# Patient Record
Sex: Male | Born: 1985 | Race: White | Hispanic: No | Marital: Single | Smoking: Never smoker
Health system: Southern US, Community
[De-identification: ages and names within clinical notes are randomized; demographics above are authoritative.]

## PROBLEM LIST (undated history)

## (undated) DIAGNOSIS — D2121 Benign neoplasm of connective and other soft tissue of right lower limb, including hip: Secondary | ICD-10-CM

## (undated) HISTORY — PX: LAPAROSCOPIC APPENDECTOMY: SUR753

---
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
## 2016-03-24 DIAGNOSIS — S6291XA Unspecified fracture of right wrist and hand, initial encounter for closed fracture: Secondary | ICD-10-CM | POA: Diagnosis not present

## 2016-03-24 DIAGNOSIS — S6991XA Unspecified injury of right wrist, hand and finger(s), initial encounter: Secondary | ICD-10-CM | POA: Diagnosis not present

## 2016-03-25 DIAGNOSIS — S62316A Displaced fracture of base of fifth metacarpal bone, right hand, initial encounter for closed fracture: Secondary | ICD-10-CM | POA: Diagnosis not present

## 2016-03-27 DIAGNOSIS — S63054A Dislocation of other carpometacarpal joint of right hand, initial encounter: Secondary | ICD-10-CM | POA: Diagnosis not present

## 2016-04-07 DIAGNOSIS — S62316D Displaced fracture of base of fifth metacarpal bone, right hand, subsequent encounter for fracture with routine healing: Secondary | ICD-10-CM | POA: Diagnosis not present

## 2016-04-22 DIAGNOSIS — S62316D Displaced fracture of base of fifth metacarpal bone, right hand, subsequent encounter for fracture with routine healing: Secondary | ICD-10-CM | POA: Diagnosis not present

## 2016-04-29 DIAGNOSIS — T84498A Other mechanical complication of other internal orthopedic devices, implants and grafts, initial encounter: Secondary | ICD-10-CM | POA: Diagnosis not present

## 2016-05-09 DIAGNOSIS — S62316D Displaced fracture of base of fifth metacarpal bone, right hand, subsequent encounter for fracture with routine healing: Secondary | ICD-10-CM | POA: Diagnosis not present

## 2016-06-10 DIAGNOSIS — S62316D Displaced fracture of base of fifth metacarpal bone, right hand, subsequent encounter for fracture with routine healing: Secondary | ICD-10-CM | POA: Diagnosis not present

## 2016-06-10 DIAGNOSIS — Z4789 Encounter for other orthopedic aftercare: Secondary | ICD-10-CM | POA: Diagnosis not present

## 2016-07-29 DIAGNOSIS — S134XXA Sprain of ligaments of cervical spine, initial encounter: Secondary | ICD-10-CM | POA: Diagnosis not present

## 2016-07-29 DIAGNOSIS — S338XXA Sprain of other parts of lumbar spine and pelvis, initial encounter: Secondary | ICD-10-CM | POA: Diagnosis not present

## 2016-07-29 DIAGNOSIS — M546 Pain in thoracic spine: Secondary | ICD-10-CM | POA: Diagnosis not present

## 2016-10-05 ENCOUNTER — Encounter (HOSPITAL_COMMUNITY): Payer: Self-pay

## 2016-10-05 ENCOUNTER — Emergency Department (HOSPITAL_COMMUNITY)
Admission: EM | Admit: 2016-10-05 | Discharge: 2016-10-05 | Disposition: A | Discharge status: Home / Self Care | Payer: 59 | Attending: Emergency Medicine | Admitting: Emergency Medicine

## 2016-10-05 DIAGNOSIS — J020 Streptococcal pharyngitis: Secondary | ICD-10-CM | POA: Diagnosis not present

## 2016-10-05 DIAGNOSIS — J36 Peritonsillar abscess: Secondary | ICD-10-CM | POA: Diagnosis not present

## 2016-10-05 DIAGNOSIS — J029 Acute pharyngitis, unspecified: Secondary | ICD-10-CM | POA: Diagnosis not present

## 2016-10-05 HISTORY — DX: Benign neoplasm of connective and other soft tissue of right lower limb, including hip: D21.21

## 2016-10-05 LAB — RAPID STREP SCREEN (MED CTR MEBANE ONLY): Streptococcus, Group A Screen (Direct): POSITIVE — AB

## 2016-10-05 MED ORDER — PENICILLIN V POTASSIUM 250 MG PO TABS: 500.0000 mg | ORAL_TABLET | Freq: Once | ORAL | Status: DC

## 2016-10-05 MED ORDER — DEXAMETHASONE 4 MG PO TABS
6.0000 mg | ORAL_TABLET | Freq: Once | ORAL | Status: AC
  Administered 2016-10-05: 6 mg via ORAL
  Filled 2016-10-05: qty 2

## 2016-10-05 MED ORDER — PENICILLIN G BENZATHINE 1200000 UNIT/2ML IM SUSP
1.2000 10*6.[IU] | Freq: Once | INTRAMUSCULAR | Status: AC
  Administered 2016-10-05: 1.2 10*6.[IU] via INTRAMUSCULAR
  Filled 2016-10-05: qty 2

## 2016-10-05 NOTE — ED Provider Notes (Signed)
[facility] DEPT Provider Note   CSN: [id number] Arrival date & time: 10/05/16  0008     History   Chief Complaint Chief Complaint  Patient presents with  . Sore Throat    HPI Oscar Henry is a 31 y.o. male.  This a normally healthy 31 year old male who presents with 9 or 10 days of sore throat.  He said initially it was on the left, but now it's on the right.  Some painful swallowing but is able to handle his own secretions.  Denies any shortness of breath, or fever, but he states that he has had chills      Past Medical History:  Diagnosis Date  . Fibroma of right lower leg 2004    There are no active problems to display for this patient.   Past Surgical History:  Procedure Laterality Date  . LAPAROSCOPIC APPENDECTOMY         Home Medications    Prior to Admission medications   Not on File    Family History No family history on file.  Social History Social History  Substance Use Topics  . Smoking status: Never Smoker  . Smokeless tobacco: Not on file  . Alcohol use Yes     Allergies   Patient has no known allergies.   Review of Systems Review of Systems  Constitutional: Positive for chills. Negative for fever.  HENT: Positive for sore throat and trouble swallowing. Negative for congestion.   Respiratory: Negative for cough and shortness of breath.   All other systems reviewed and are negative.    Physical Exam Updated Vital Signs BP 129/85 (BP Location: Left Arm)   Pulse 94   Temp 98.1 F (36.7 C) (Oral)   Resp 16   Ht 6' 1.5" (1.867 m)   Wt 102.1 kg (225 lb)   SpO2 97%   BMI 29.28 kg/m   Physical Exam  Constitutional: He appears well-developed and well-nourished. No distress.  HENT:  Head: Normocephalic.  Mouth/Throat:    No uvula deviation  Eyes: Pupils are equal, round, and reactive to light. EOM are normal.  Cardiovascular: Normal rate.   Pulmonary/Chest: Effort normal.  Abdominal: Soft.  Musculoskeletal:  Normal range of motion.  Lymphadenopathy:    He has cervical adenopathy.  Neurological: He is alert.  Skin: Skin is warm. No rash noted.  Nursing note and vitals reviewed.    ED Treatments / Results  Labs (all labs ordered are listed, but only abnormal results are displayed) Labs Reviewed  RAPID STREP SCREEN (NOT AT [facility]) - Abnormal; Notable for the following:       Result Value   Streptococcus, Group A Screen (Direct) POSITIVE (*)    All other components within normal limits    EKG  EKG Interpretation None       Radiology No results found.  Procedures Procedures (including critical care time)  Medications Ordered in ED Medications  dexamethasone (DECADRON) tablet 6 mg (not administered)  penicillin g benzathine (BICILLIN LA) 1200000 UNIT/2ML injection 1.2 Million Units (not administered)     Initial Impression / Assessment and Plan / ED Course  I have reviewed the triage vital signs and the nursing notes.  Pertinent labs & imaging results that were available during my care of the patient were reviewed by me and considered in my medical decision making (see chart for details).     Patient is strep positive.  Due to the physical nature of this area.  Concerned that he may have a  tonsillar abscess.  This has been discussed with the patient.  He will follow-up with ENT in the next 2-3 days He will be treated with IM penicillin and by mouth Decadron 1 dose   Final Clinical Impressions(s) / ED Diagnoses   Final diagnoses:  Strep pharyngitis  Peritonsillar abscess    New Prescriptions New Prescriptions   No medications on file     Junius Creamer, NP 10/05/16 0121    Ward, Delice Bison, DO 10/05/16 0134

## 2016-10-05 NOTE — ED Triage Notes (Signed)
Pt to ED c/o sore throat x 1 week, reports pain swallowing/eating as well as sinus pressure, ear pain. Pt denies fevers. Tonsillar inflammation noted.

## 2016-10-05 NOTE — Discharge Instructions (Addendum)
You have been given a dose of antibiotic in the emergency department and should not need any further antibiotics.  You have been given a referral to ENT.  Please call on Monday and make an appointment for further evaluation.  He can safely take alternating doses of Tylenol, ibuprofen for discomfort

## 2017-03-18 DIAGNOSIS — M25561 Pain in right knee: Secondary | ICD-10-CM | POA: Diagnosis not present

## 2017-03-18 DIAGNOSIS — M545 Low back pain: Secondary | ICD-10-CM | POA: Diagnosis not present

## 2017-03-24 DIAGNOSIS — M545 Low back pain: Secondary | ICD-10-CM | POA: Diagnosis not present

## 2017-03-24 DIAGNOSIS — M25561 Pain in right knee: Secondary | ICD-10-CM | POA: Diagnosis not present

## 2017-03-25 DIAGNOSIS — M545 Low back pain: Secondary | ICD-10-CM | POA: Diagnosis not present

## 2017-03-25 DIAGNOSIS — M25561 Pain in right knee: Secondary | ICD-10-CM | POA: Diagnosis not present

## 2017-03-31 DIAGNOSIS — M545 Low back pain: Secondary | ICD-10-CM | POA: Diagnosis not present

## 2017-03-31 DIAGNOSIS — M25561 Pain in right knee: Secondary | ICD-10-CM | POA: Diagnosis not present

## 2017-04-01 DIAGNOSIS — M25561 Pain in right knee: Secondary | ICD-10-CM | POA: Diagnosis not present

## 2017-04-01 DIAGNOSIS — M545 Low back pain: Secondary | ICD-10-CM | POA: Diagnosis not present

## 2017-04-07 DIAGNOSIS — M545 Low back pain: Secondary | ICD-10-CM | POA: Diagnosis not present

## 2017-04-07 DIAGNOSIS — M25561 Pain in right knee: Secondary | ICD-10-CM | POA: Diagnosis not present

## 2017-04-08 DIAGNOSIS — M25561 Pain in right knee: Secondary | ICD-10-CM | POA: Diagnosis not present

## 2017-04-08 DIAGNOSIS — M545 Low back pain: Secondary | ICD-10-CM | POA: Diagnosis not present

## 2017-04-14 DIAGNOSIS — J039 Acute tonsillitis, unspecified: Secondary | ICD-10-CM | POA: Diagnosis not present

## 2018-02-26 DIAGNOSIS — M545 Low back pain: Secondary | ICD-10-CM | POA: Diagnosis not present

## 2018-02-26 DIAGNOSIS — M25561 Pain in right knee: Secondary | ICD-10-CM | POA: Diagnosis not present

## 2019-08-30 ENCOUNTER — Ambulatory Visit: Payer: Self-pay

## 2019-08-30 ENCOUNTER — Other Ambulatory Visit: Payer: Self-pay

## 2019-08-30 ENCOUNTER — Encounter: Payer: Self-pay | Admitting: Orthopaedic Surgery

## 2019-08-30 ENCOUNTER — Ambulatory Visit: Payer: 59 | Admitting: Orthopaedic Surgery

## 2019-08-30 DIAGNOSIS — M25521 Pain in right elbow: Secondary | ICD-10-CM

## 2019-08-30 DIAGNOSIS — G8929 Other chronic pain: Secondary | ICD-10-CM

## 2019-08-30 DIAGNOSIS — M25561 Pain in right knee: Secondary | ICD-10-CM | POA: Diagnosis not present

## 2019-08-30 MED ORDER — METHYLPREDNISOLONE ACETATE 40 MG/ML IJ SUSP
40.0000 mg | INTRAMUSCULAR | Status: AC | PRN
  Administered 2019-08-30: 40 mg via INTRA_ARTICULAR

## 2019-08-30 MED ORDER — LIDOCAINE HCL 1 % IJ SOLN
3.0000 mL | INTRAMUSCULAR | Status: AC | PRN
  Administered 2019-08-30: 3 mL

## 2019-08-30 NOTE — Progress Notes (Signed)
Office Visit Note   Patient: Oscar Henry           Date of Birth: Feb 26, 1985           MRN: [id number] Visit Date: 08/30/2019              Requested by: [facility] [place],  [place] [id number] PCP: [facility]   Assessment & Plan: Visit Diagnoses:  1. Chronic pain of right knee   2. Pain in right elbow     Plan: He will perform quad strengthening exercises as shown today.  He can apply Voltaren gel to the knee up to 4 g 4 times daily.  In regards to knee exercises discussed knee friendly exercises with him today.  We will see how he is doing at his return visit in regards to the knee status post injection.  In regards to the right elbow due to the possible loose bodies within the elbow recommend MRI to evaluate for loose body.  We will have him work on lifting techniques for the lateral epicondylitis and stretching techniques as shown.  Also apply Voltaren gel up to 3 times daily to the lateral aspect of the elbow.  Questions were encouraged and answered at length.  Follow-Up Instructions: Return After MRI.   Orders:  Orders Placed This Encounter  Procedures  . Large Joint Inj  . XR Knee 1-2 Views Right  . XR Elbow 2 Views Right   No orders of the defined types were placed in this encounter.     Procedures: Large Joint Inj: R knee on 08/30/2019 9:28 AM Indications: pain Details: 22 G 1.5 in needle, anterolateral approach  Arthrogram: No  Medications: 3 mL lidocaine 1 %; 40 mg methylPREDNISolone acetate 40 MG/ML Outcome: tolerated well, no immediate complications Procedure, treatment alternatives, risks and benefits explained, specific risks discussed. Consent was given by the patient. Immediately prior to procedure a time out was called to verify the correct patient, procedure, equipment, support staff and site/side marked as required. Patient was prepped and draped in the usual sterile fashion.       Clinical Data: No additional  findings.   Subjective: Chief Complaint  Patient presents with  . Right Elbow - Pain  . Right Knee - Pain    HPI Oscar Henry is a 34 year old male comes in today with right elbow and right knee pain which has been ongoing for years.  Full active weight.  Played baseball, basketball, football wrestling.  Also was in the [employer].  He states in regards to the right elbow that most of his pain is localized to one point he points to the right lateral epicondyle region.  He does report a fall on concrete while in the [employer] feels that he may have some possible bony fragments in the elbow but has had no x-rays on this.  He notes that the pain lateral aspect the elbow is sharp with activities such as gripping a coffee cup or anything that is heavy.  Has had no treatment for the right elbow pain.  Pain is not constant.  Right knee pain has been ongoing for years no known injury.  Mechanical symptoms positive for giving way only.  Denies any swelling.  Pain is mostly anterior aspect knee.  Has pain going up and down stairs and also up hills.  He notes that he just not have the strength in the right leg compared to his left leg.  He does have some  discomfort in the left leg but not to the extent that he has in the right knee.  Recently took a Medrol Dosepak about 2 months ago due to some poison oak.  He feels that the Medrol helped with his L5-S1 degenerative disc disease but really did not help with his elbow or his knee.  He does not take any anti-inflammatories on a daily basis.  Did try knee brace with no relief.  Otherwise he has had no treatment for the above complaints.  Review of Systems  Constitutional: Negative for chills and fever.  Cardiovascular: Negative for chest pain.  Musculoskeletal: Positive for arthralgias and back pain.     Objective: Vital Signs: There were no vitals taken for this visit.  Physical Exam Constitutional:      Appearance: He is not ill-appearing or diaphoretic.    Cardiovascular:     Pulses: Normal pulses.  Pulmonary:     Effort: Pulmonary effort is normal.  Neurological:     Mental Status: He is alert.  Psychiatric:        Mood and Affect: Mood normal.     Ortho Exam Bilateral knees: Full range of motion.  No tenderness along medial lateral joint line.  No instability with valgus varus stressing.  Anterior drawer is negative bilaterally.  Patellofemoral crepitus bilaterally with passive range of motion of both knees.  No abnormal warmth erythema or effusion of either knee.  Right elbow full range of motion.  Tenderness over the right lateral epicondyle region.  Provocative maneuvers causes pain lateral epicondyle region.  Slight crepitus with passive range of motion of the right elbow.  Full supination pronation right forearm.  No rashes skin lesions ulcerations about the right elbow. Specialty Comments:  No specialty comments available.  Imaging: XR Elbow 2 Views Right  Result Date: 08/30/2019 Right elbow 2 views: No acute fractures or bony abnormalities.  Possible loose bodies within the elbow joint seen particularly on the lateral view.  Elbow is well located.  XR Knee 1-2 Views Right  Result Date: 08/30/2019 Right knee 2 views: No acute fractures.  Medial lateral joint line well-preserved.  Slight irregularity to the patella articular surface on the lateral view.  Otherwise no bony abnormalities knee is well located.    PMFS History: There are no problems to display for this patient.  Past Medical History:  Diagnosis Date  . Fibroma of right lower leg 2004    History reviewed. No pertinent family history.  Past Surgical History:  Procedure Laterality Date  . LAPAROSCOPIC APPENDECTOMY     Social History   Occupational History  . Not on file  Tobacco Use  . Smoking status: Never Smoker  Substance and Sexual Activity  . Alcohol use: Yes  . Drug use: No  . Sexual activity: Not on file

## 2019-09-27 ENCOUNTER — Ambulatory Visit
Admission: RE | Admit: 2019-09-27 | Discharge: 2019-09-27 | Disposition: A | Discharge status: Home / Self Care | Payer: 59 | Source: Ambulatory Visit | Attending: Physician Assistant | Admitting: Physician Assistant

## 2019-09-27 ENCOUNTER — Other Ambulatory Visit: Payer: Self-pay

## 2019-09-27 DIAGNOSIS — M25521 Pain in right elbow: Secondary | ICD-10-CM

## 2019-09-27 IMAGING — MR MR ELBOW*R* W/O CM
4 of 5 series · 13 of 40 positions shown · non-contrast
Comparison: None.

CLINICAL DATA: Right elbow pain.  Chronic mechanical symptoms

EXAM:
MRI OF THE RIGHT ELBOW WITHOUT CONTRAST
TECHNIQUE: Multiplanar, multisequence MR imaging of the elbow was performed. No
intravenous contrast was administered.

[Series 3: T1 · axial · right · 3.0mm · 0.16mm/px · z∈[-16,+66]mm · 3 of 29 slices shown]
[im 4/29]
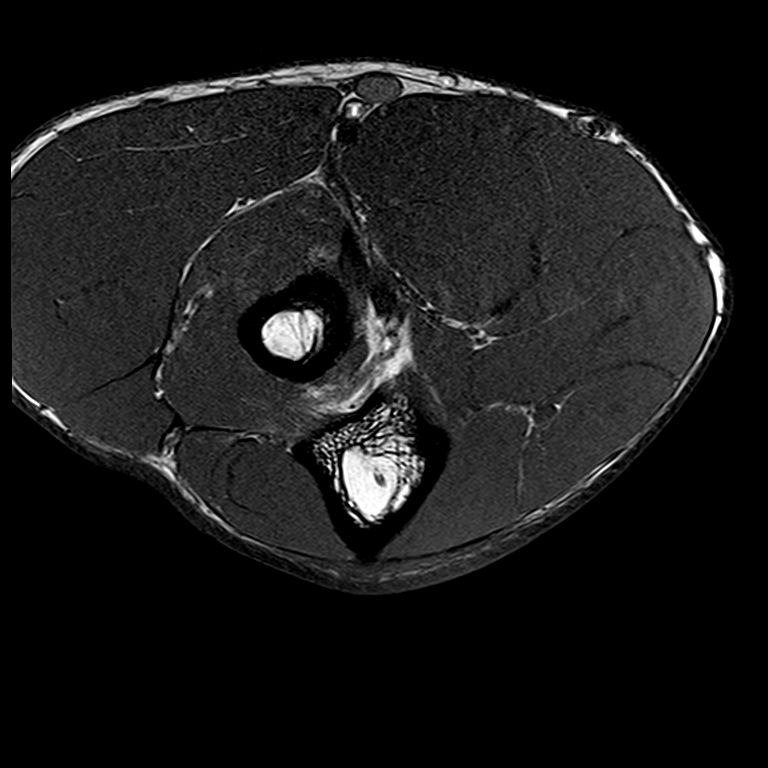
[im 15/29]
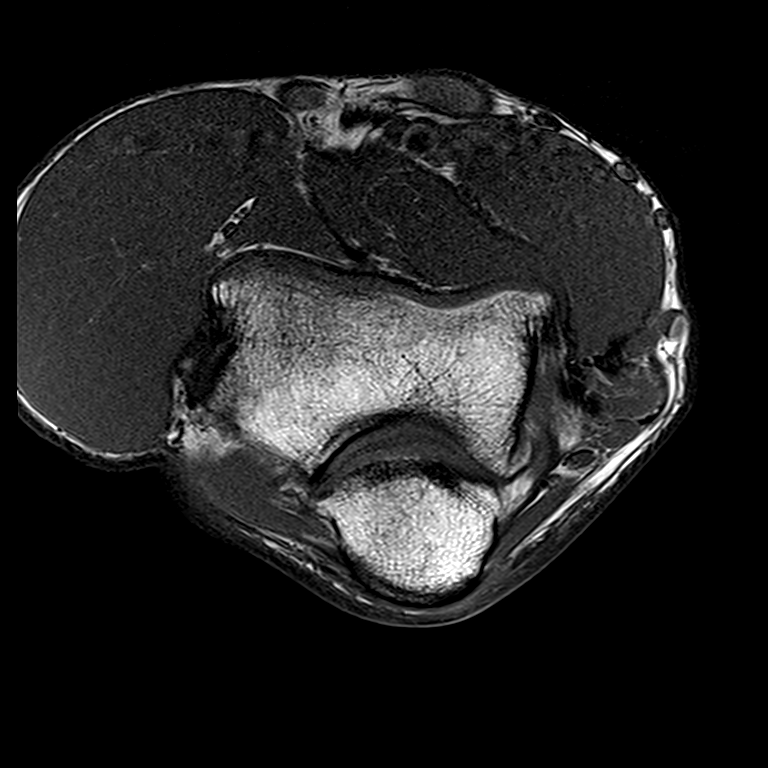
[im 25/29]
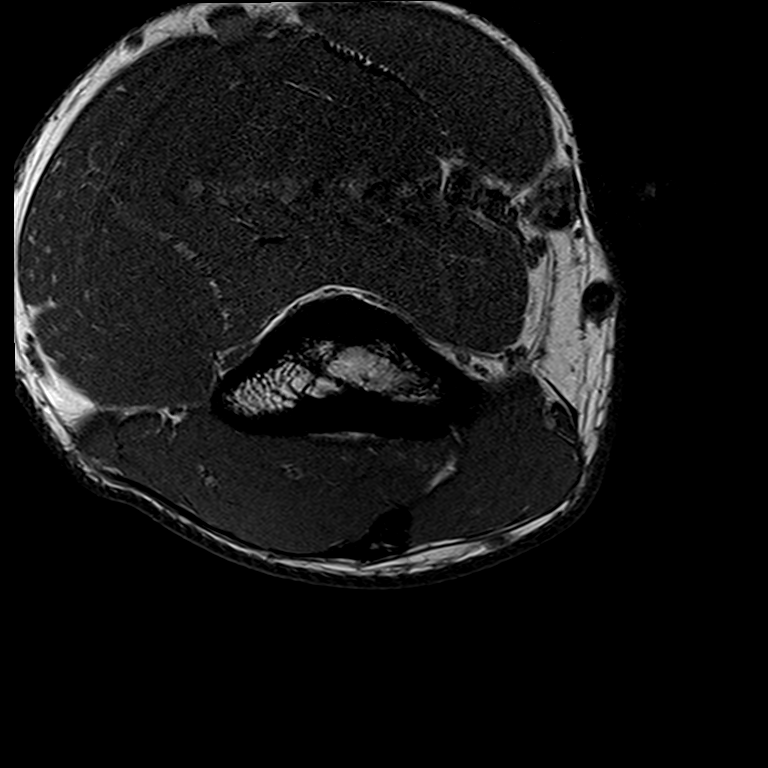

[Series 4: T2 fat-sat · axial · right · 3.0mm · 0.19mm/px · z∈[-16,+66]mm · 3 of 29 slices shown (1 of 2)]
[im 4/29]
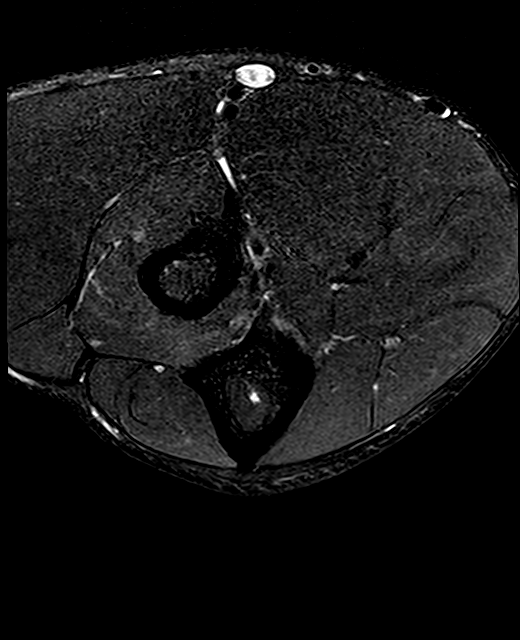
[im 15/29]
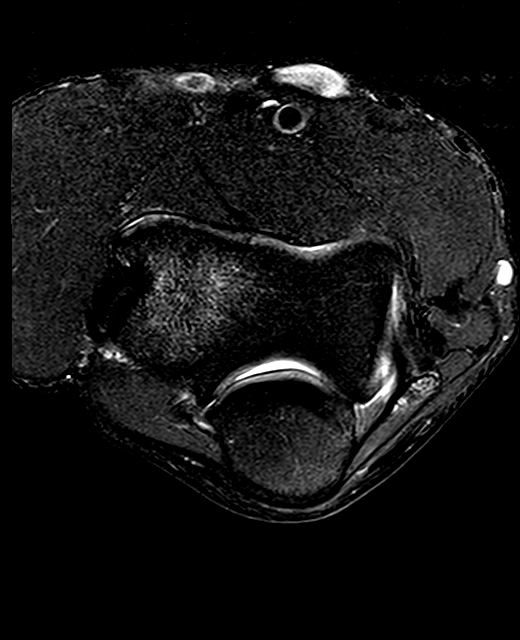
[im 25/29]
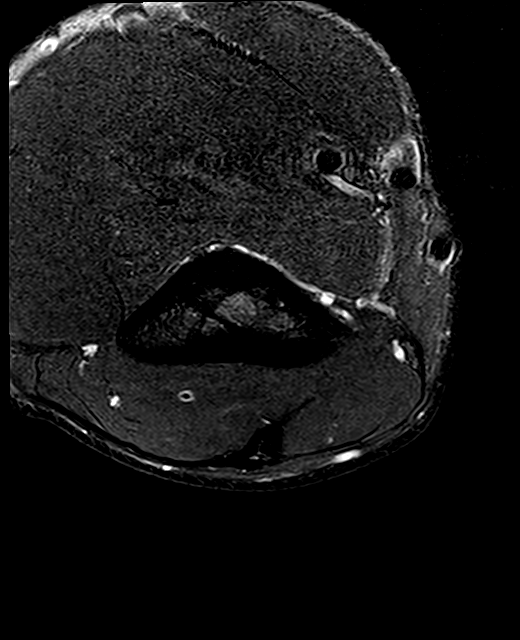

[Series 5: T2 fat-sat · coronal · right · 3.0mm · 0.22mm/px · 3 of 21 slices shown (2 of 2)]
[im 4/21]
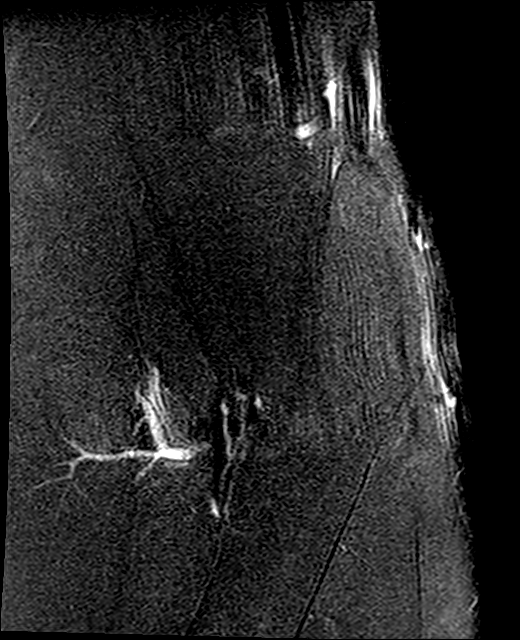
[im 11/21]
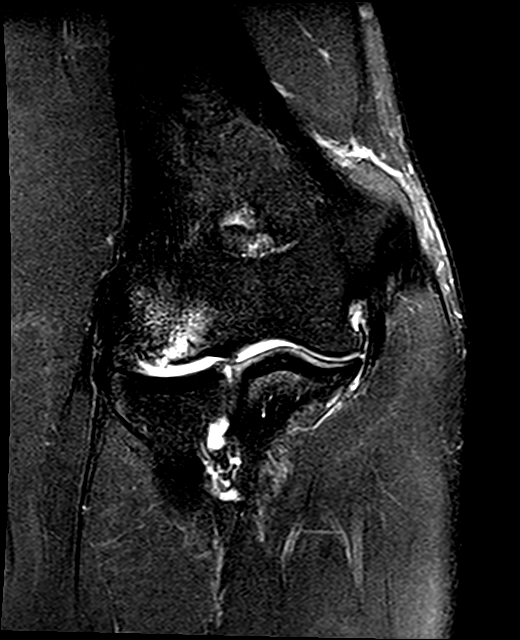
[im 17/21]
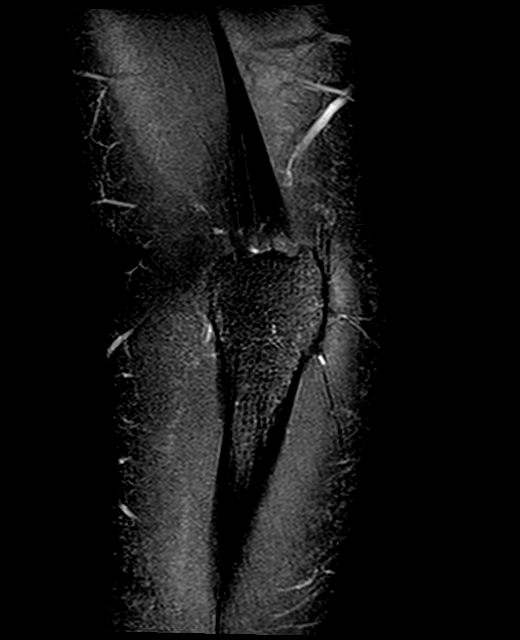

[Series 7: PD fat-sat · sagittal · right · 3.0mm · 0.22mm/px · 4 of 25 slices shown]
[im 1/25]
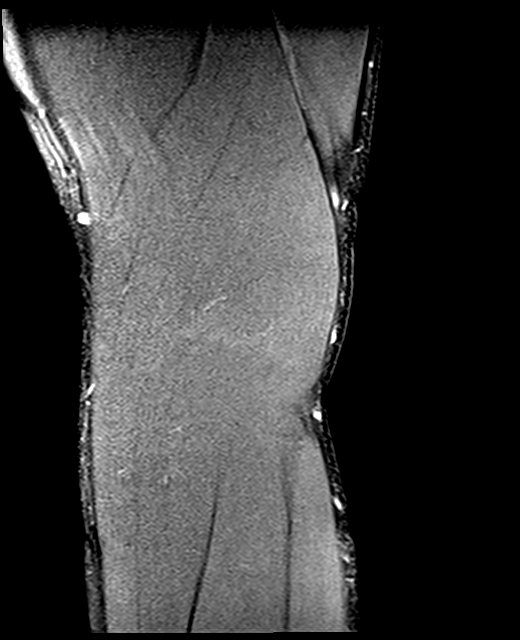
[im 4/25]
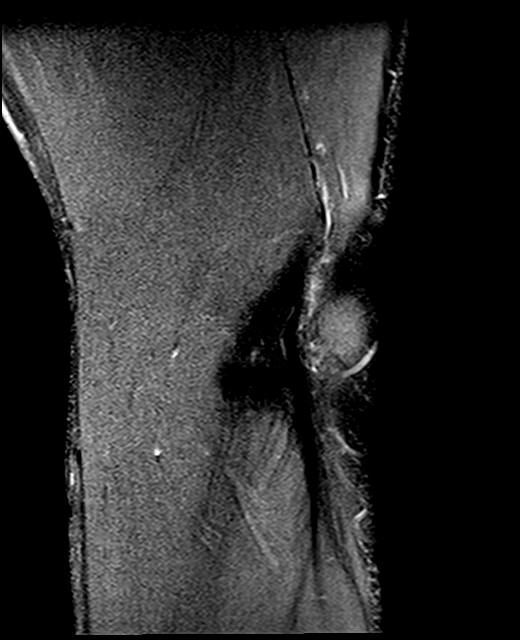
[im 14/25]
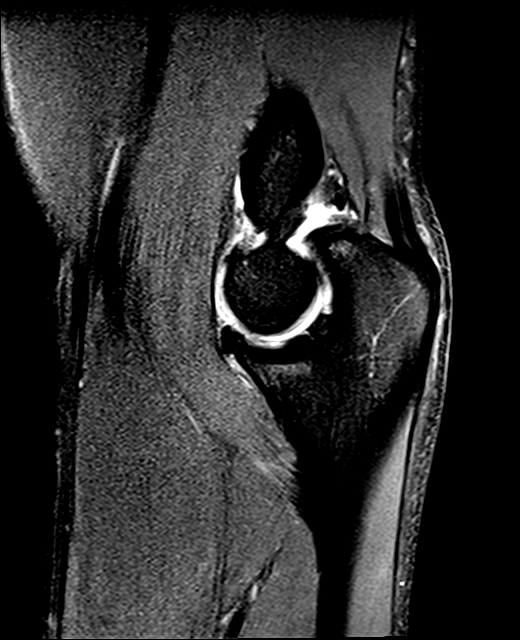
[im 21/25]
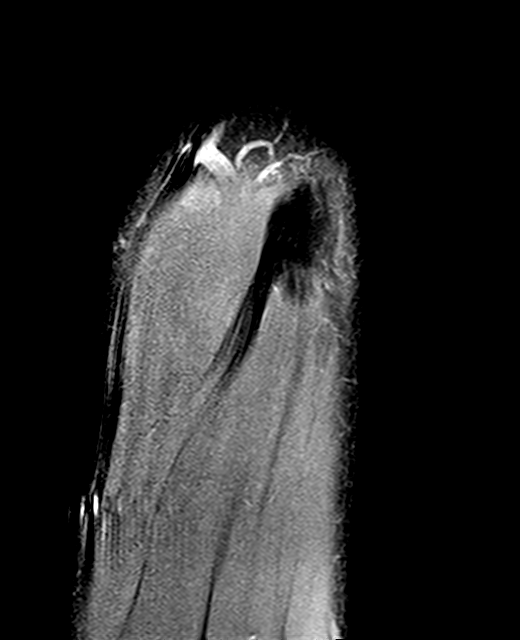

[13 of 40 positions shown; findings below may reference images not displayed]

FINDINGS: TENDONS

Common forearm flexor origin: Mild tendinosis of the common flexor
tendon origin.

Common forearm extensor origin: Intact

Biceps: Intact

Triceps: Intact

LIGAMENTS

Medial stabilizers: Intact

Lateral stabilizers:  Intact

Cartilage: Partial thickness cartilage loss of the capitellum with
areas of full-thickness cartilage loss and subchondral reactive
marrow edema. Partial thickness cartilage loss of the radial head.
Partial-thickness cartilage loss of the ulnohumeral joint with small
marginal osteophytes.

Joint: Small joint effusion.  No intra-articular loose body.

Cubital tunnel: Normal.

Bones: No fracture or dislocation. No marrow abnormality.

Soft Tissues: Muscles are normal. No fluid collection or hematoma.
IMPRESSION: 1. Mild tendinosis of the common flexor tendon origin.
2. Partial thickness cartilage loss of the capitellum with areas of
full-thickness cartilage loss and subchondral reactive marrow edema.
Partial thickness cartilage loss of the radial head.
3. Partial thickness cartilage loss of the ulnohumeral joint with
small marginal osteophytes.

## 2019-10-10 ENCOUNTER — Encounter: Payer: Self-pay | Admitting: Orthopaedic Surgery

## 2019-10-10 ENCOUNTER — Ambulatory Visit: Payer: 59 | Admitting: Orthopaedic Surgery

## 2019-10-10 ENCOUNTER — Other Ambulatory Visit: Payer: Self-pay

## 2019-10-10 DIAGNOSIS — M19021 Primary osteoarthritis, right elbow: Secondary | ICD-10-CM | POA: Diagnosis not present

## 2019-10-10 DIAGNOSIS — M25561 Pain in right knee: Secondary | ICD-10-CM

## 2019-10-10 DIAGNOSIS — G8929 Other chronic pain: Secondary | ICD-10-CM | POA: Diagnosis not present

## 2019-10-10 NOTE — Progress Notes (Signed)
The patient is a 34 year old gentleman seen in follow-up to go over an MRI of his right elbow.  He is also had a steroid injection in his right knee we will do see how he responded to that.  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  On examination of his right elbow he can flex and extend elbow.  It is ligamentously stable on exam.  The MRI does show areas of partial to full-thickness cartilage loss of the radiocapitellar joint and the ulnohumeral joint with no loose bodies.  There are some slight subchondral edema in the capitellum.  The ligaments and tendons again are all intact.  Examination of his right knee does show some patellofemoral pain as well as pain when stressing the medial compartment of the knee.  The knee does slightly hyperextend.  At this point given the failure of conservative treatment on his right knee we will obtain an MRI of the right knee.  From an elbow standpoint, we are going to hold off on any other treatment since this is more of an annoying type of the pain that may benefit from steroid injection at some point if it bothers him enough.  He knows to hold off any heavy lifting for that elbow and avoiding push-ups and pull-ups as well.  All question concerns were answered addressed.  We will see him back after the MRI of his right knee.

## 2019-10-25 ENCOUNTER — Ambulatory Visit: Payer: 59 | Admitting: Orthopaedic Surgery

## 2019-10-31 ENCOUNTER — Ambulatory Visit
Admission: RE | Admit: 2019-10-31 | Discharge: 2019-10-31 | Disposition: A | Discharge status: Home / Self Care | Payer: 59 | Source: Ambulatory Visit | Attending: Orthopaedic Surgery | Admitting: Orthopaedic Surgery

## 2019-10-31 DIAGNOSIS — G8929 Other chronic pain: Secondary | ICD-10-CM

## 2019-10-31 DIAGNOSIS — M25561 Pain in right knee: Secondary | ICD-10-CM

## 2019-10-31 IMAGING — MR MR KNEE*R* W/O CM
4 of 6 series · 19 of 40 positions shown · non-contrast
Comparison: None.

CLINICAL DATA: Right knee pain and weakness.

EXAM:
MRI OF THE RIGHT KNEE WITHOUT CONTRAST
TECHNIQUE: Multiplanar, multisequence MR imaging of the knee was performed. No
intravenous contrast was administered.

[Series 3: T2 fat-sat · axial · 4.0mm · 0.31mm/px · z∈[-60,+28]mm · 3 of 30 slices shown (1 of 2)]
[im 5/30]
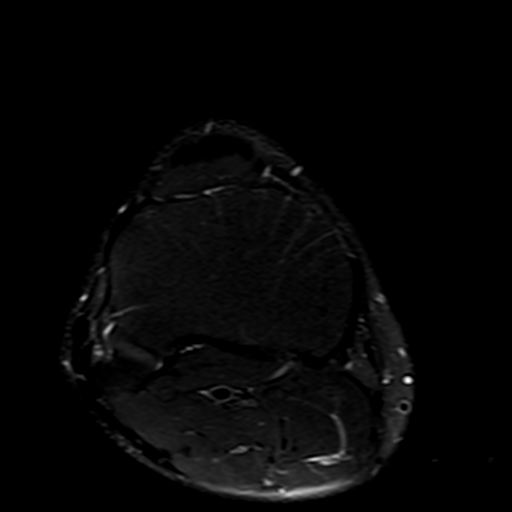
[im 17/30]
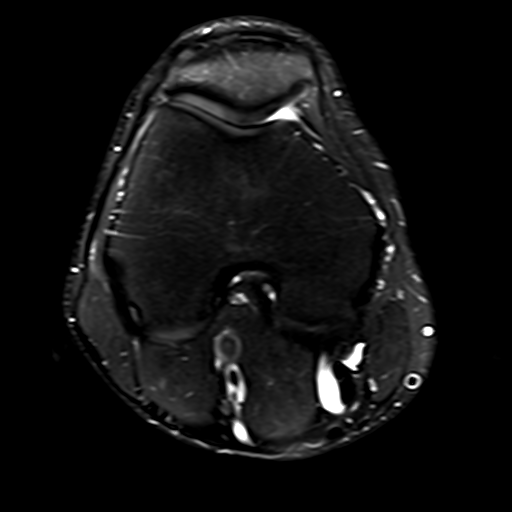
[im 25/30]
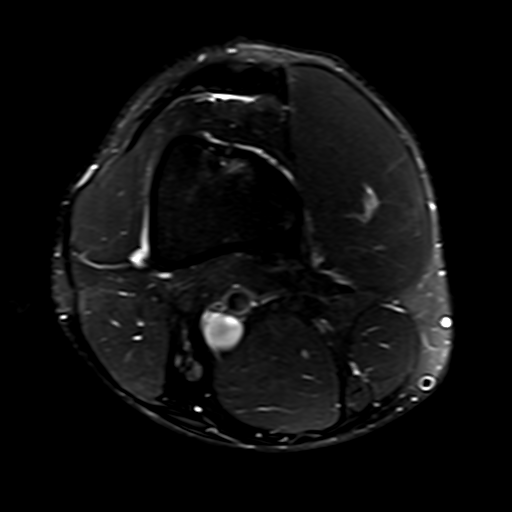

[Series 5: T2 fat-sat · coronal · 4.0mm · 0.29mm/px · 3 of 28 slices shown (2 of 2)]
[im 6/28]
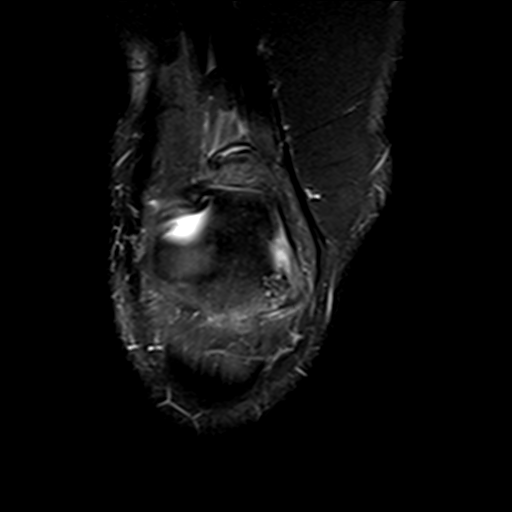
[im 17/28]
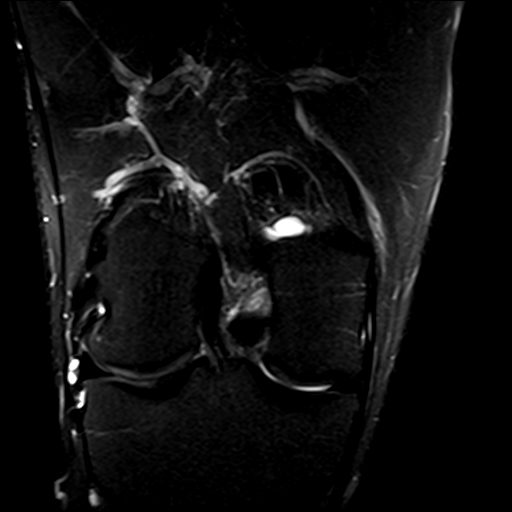
[im 28/28]
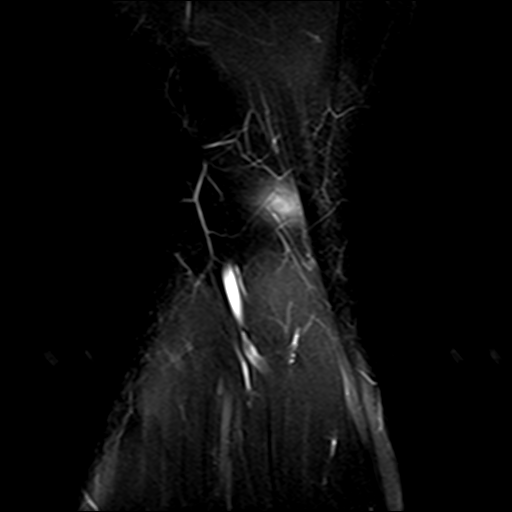

[Series 6: PD fat-sat · coronal · 4.0mm · 0.29mm/px · 6 of 28 slices shown (1 of 2)]
[im 1/28]
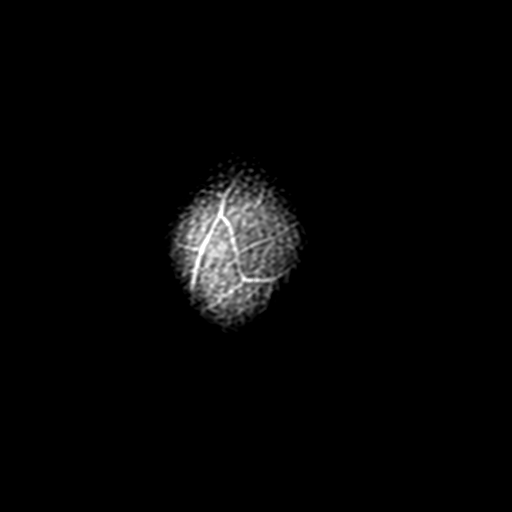
[im 6/28]
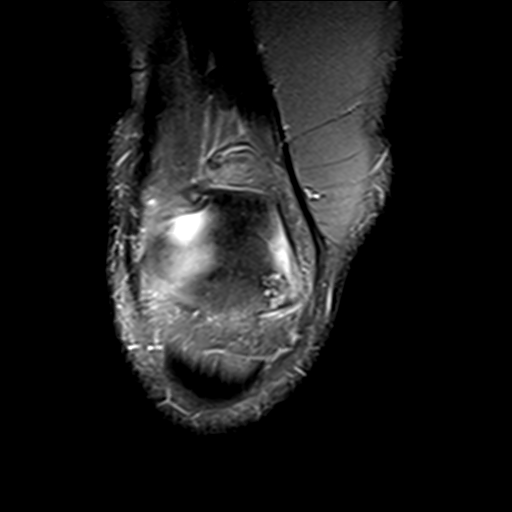
[im 11/28]
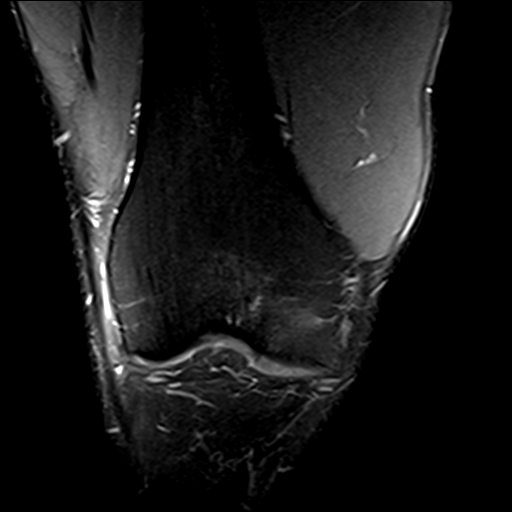
[im 17/28]
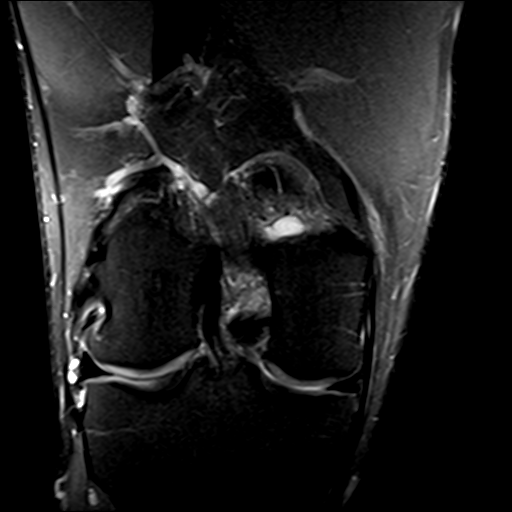
[im 22/28]
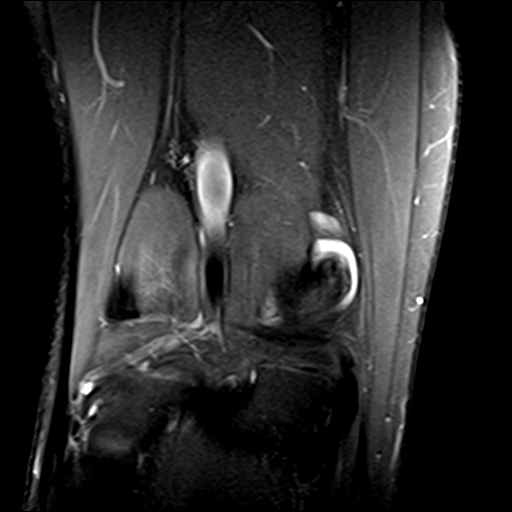
[im 28/28]
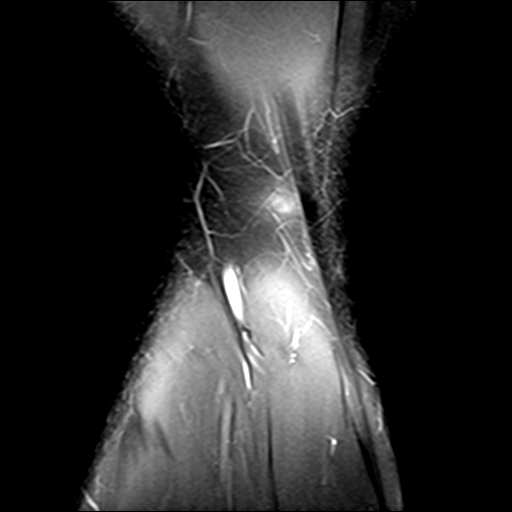

[Series 7: PD fat-sat · sagittal · 3.0mm · 0.29mm/px · 7 of 30 slices shown (2 of 2)]
[im 1/30]
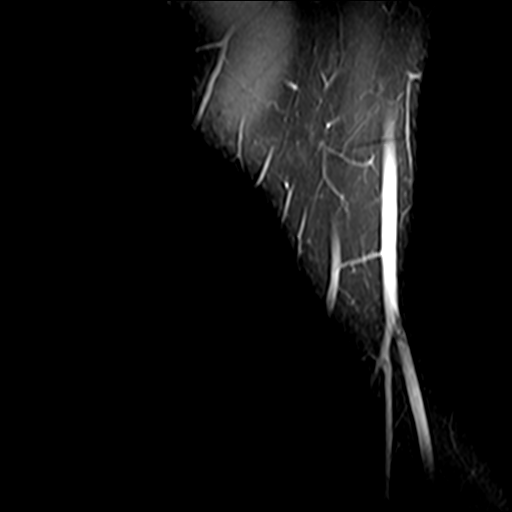
[im 5/30]
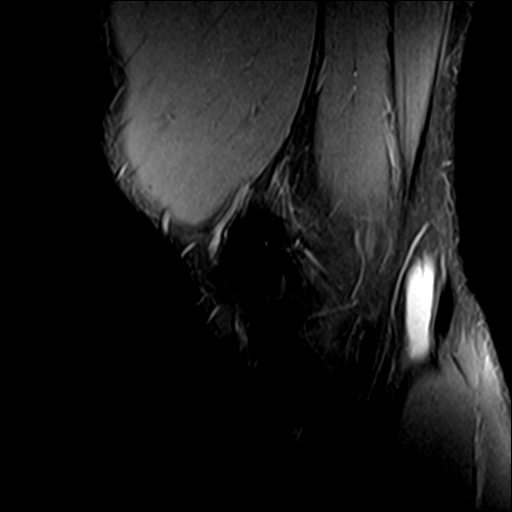
[im 10/30]
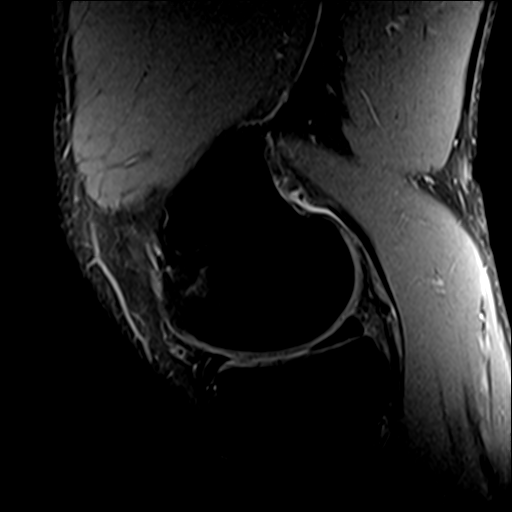
[im 15/30]
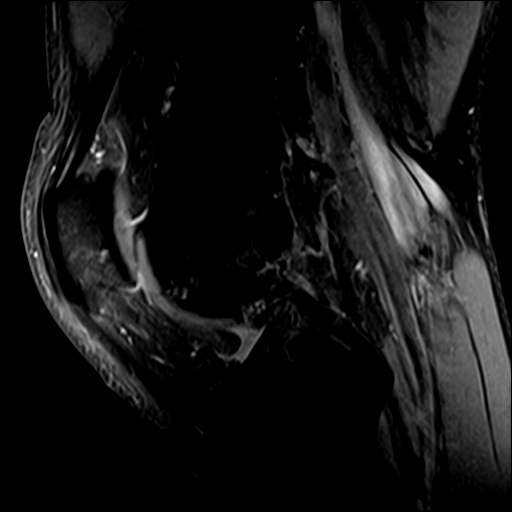
[im 20/30]
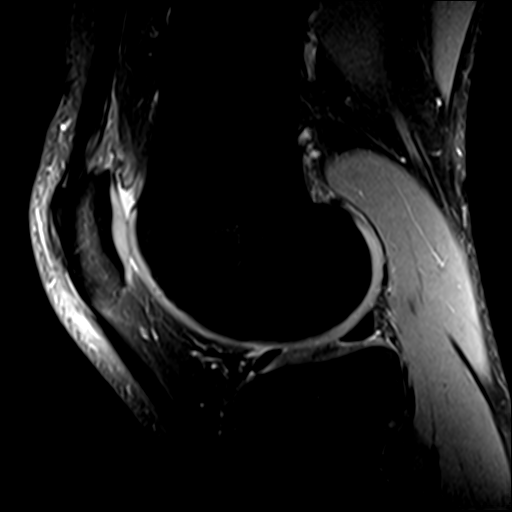
[im 25/30]
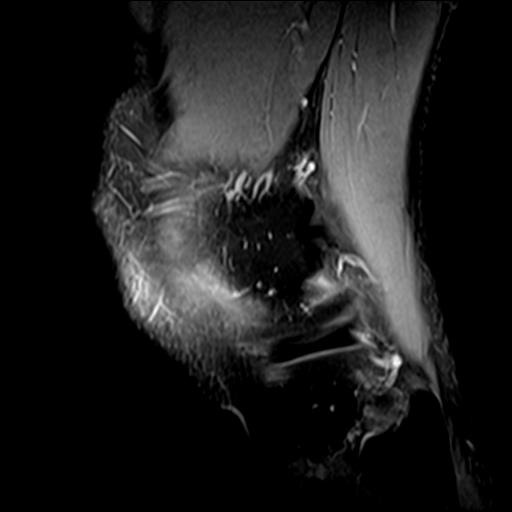
[im 30/30]
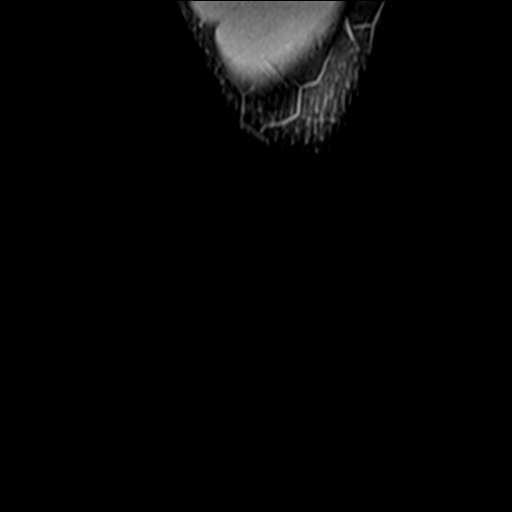

[19 of 40 positions shown; findings below may reference images not displayed]

FINDINGS: MENISCI

Medial meniscus: Minimal accentuated signal centrally in the
posterior horn without extension to a meniscal surface, no tear
identified.

Lateral meniscus:  Unremarkable

LIGAMENTS

Cruciates:  Unremarkable

Collaterals:  Unremarkable

CARTILAGE

Patellofemoral: Degenerative appearing 0.8 cm osteochondral lesion
inferiorly along the medial patellar facet with overlying chondral
heterogeneity and irregularity, and underlying confluent
degenerative subcortical cystic lesions as on image 23 series 5.
Similar lesions inferiorly along the central and medial femoral
trochlear groove. Moderate chondral thinning inferiorly along the
femoral trochlear groove.

Medial:  Mild marginal spurring.

Lateral:  Unremarkable

Joint: Small knee effusion. Superior plica. Mildly thickened medial
plica.

Popliteal Fossa:  Small to moderate size Baker's cyst

Extensor Mechanism:  Mild prepatellar subcutaneous edema.

Bones: No significant extra-articular osseous abnormalities
identified.

Other: No supplemental non-categorized findings.
IMPRESSION: 1. Degenerative appearing osteochondral lesion inferiorly along the
medial patellar facet with underlying confluent degenerative
subcortical cystic lesions. Moderate chondral thinning inferiorly
along the femoral trochlear groove.
2. Small knee effusion with superior plica and mildly thickened
medial plica.
3. Small to moderate size Baker's cyst.
4. Mild prepatellar subcutaneous edema.
5. Minimal accentuated signal centrally in the posterior horn medial
meniscus without extension to a meniscal surface.

## 2019-11-07 ENCOUNTER — Encounter: Payer: Self-pay | Admitting: Orthopaedic Surgery

## 2019-11-07 ENCOUNTER — Ambulatory Visit: Payer: 59 | Admitting: Orthopaedic Surgery

## 2019-11-07 DIAGNOSIS — M6752 Plica syndrome, left knee: Secondary | ICD-10-CM | POA: Diagnosis not present

## 2019-11-07 DIAGNOSIS — M25569 Pain in unspecified knee: Secondary | ICD-10-CM | POA: Diagnosis not present

## 2019-11-07 NOTE — Progress Notes (Signed)
The patient is a very athletic 34 year old who is coming in for follow-up after having a MRI of his left knee.  He injured this knee years ago wrestling.  He is a thin individual and does hurt with weightbearing activities and mainly underneath his kneecap to the medial side of the knee is where most of his issues are.  He does get occasional swelling with the knee as well.  He has tried and failed conservative treatment with quad strengthening exercises as well as activity modification and a steroid injection.  Examination of the left knee shows findings consistent with patellofemoral crepitation with pain when you really compress the medial patella over the trochlear groove 20 and his knee back and forth.  There is also a click consistent with a plica.  The remainder of the knee exam is normal.  There are some fullness in the popliteal area.  The MRI of his left knee does show an osteochondral lesion of the medial facet of patella in the trochlear groove in that area as well as a medial plica that is thickened and synovitis in this area.  There is a small signal change in the posterior horn the medial meniscus but it does not look like a meniscal tear.  The medial lateral compartments are well-maintained with just only some mild arthritic changes on the medial compartment of the knee.  At this point I have recommended arthroscopic intervention for his left knee.  He has our surgery scheduler's card.  I showed him a knee model explained in detail using his MRI as well what the surgery would involve.  It would be mainly an arthroscopic debridement with debriding the plica and assessing the patellofemoral joint with a chondroplasty in this area.  We had a long thorough discussion about the risk and benefits of surgery and what to expect with this type of surgery.  He has our surgery scheduler's card and he will let us know.  All questions and concerns were answered as best as possible.

## 2020-01-28 ENCOUNTER — Emergency Department (HOSPITAL_BASED_OUTPATIENT_CLINIC_OR_DEPARTMENT_OTHER)
Admission: EM | Admit: 2020-01-28 | Discharge: 2020-01-28 | Disposition: A | Discharge status: Home / Self Care | Payer: 59 | Attending: Emergency Medicine | Admitting: Emergency Medicine

## 2020-01-28 ENCOUNTER — Emergency Department (HOSPITAL_BASED_OUTPATIENT_CLINIC_OR_DEPARTMENT_OTHER): Payer: 59

## 2020-01-28 ENCOUNTER — Other Ambulatory Visit: Payer: Self-pay

## 2020-01-28 ENCOUNTER — Encounter (HOSPITAL_BASED_OUTPATIENT_CLINIC_OR_DEPARTMENT_OTHER): Payer: Self-pay | Admitting: Emergency Medicine

## 2020-01-28 DIAGNOSIS — M79604 Pain in right leg: Secondary | ICD-10-CM

## 2020-01-28 DIAGNOSIS — M79651 Pain in right thigh: Secondary | ICD-10-CM | POA: Diagnosis not present

## 2020-01-28 IMAGING — US US EXTREM LOW VENOUS*R*
1 series · 14 of 24 positions shown · non-contrast
Comparison: None.

CLINICAL DATA: Right medial thigh pain for 2-3 days.

EXAM:
Right LOWER EXTREMITY VENOUS DOPPLER ULTRASOUND
TECHNIQUE: Gray-scale sonography with compression, as well as color and duplex
ultrasound, were performed to evaluate the deep venous system(s)
from the level of the common femoral vein through the popliteal and
proximal calf veins.

[Series 1: us extrem low venous*right* · 14 of 30 slices shown]
[im 1/30]
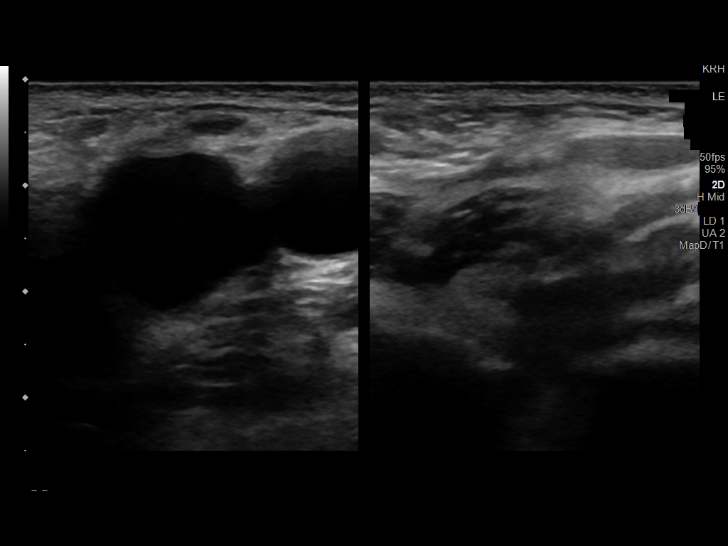
[im 3/30]
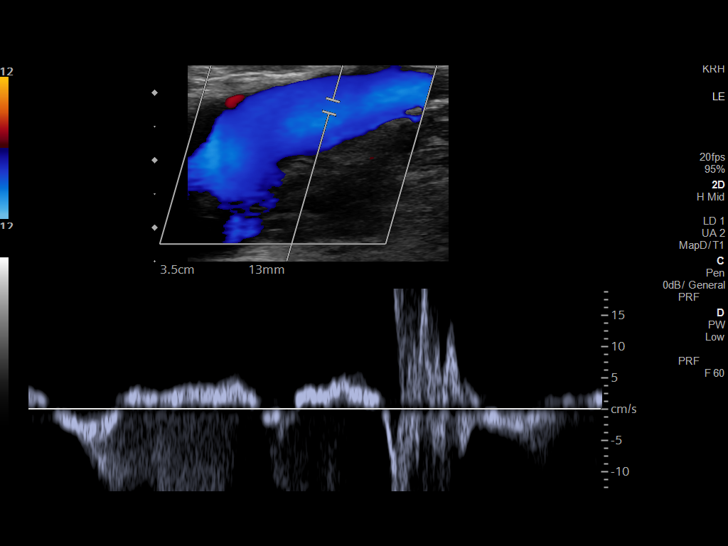
[im 6/30]
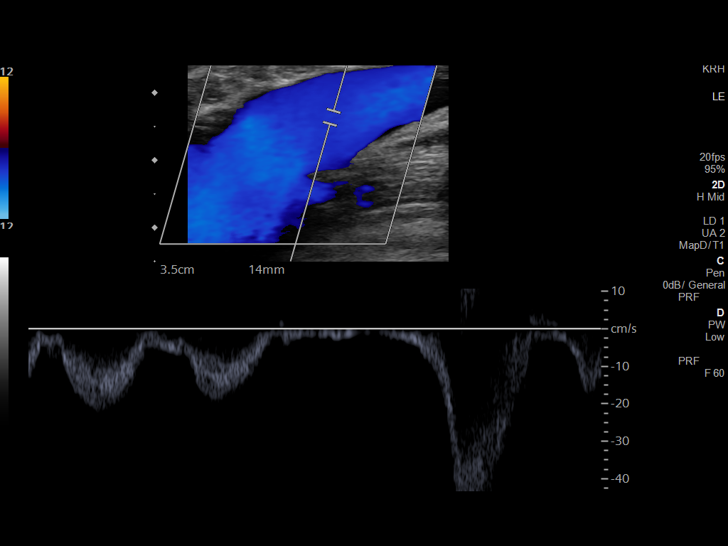
[im 8/30]
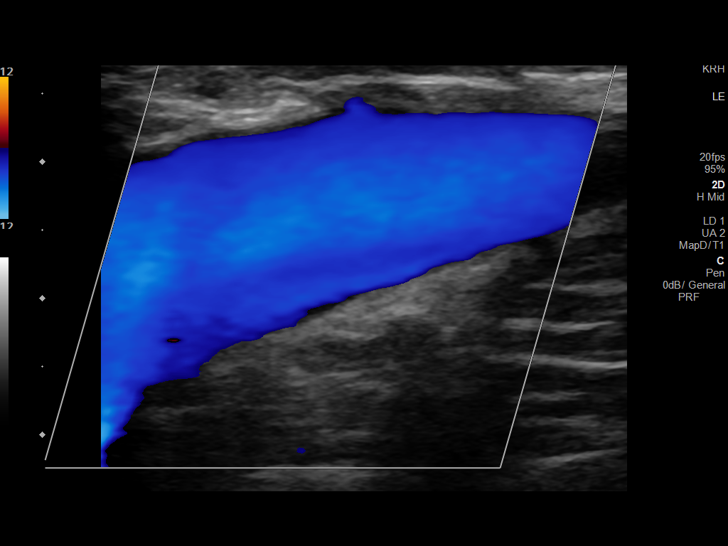
[im 9/30]
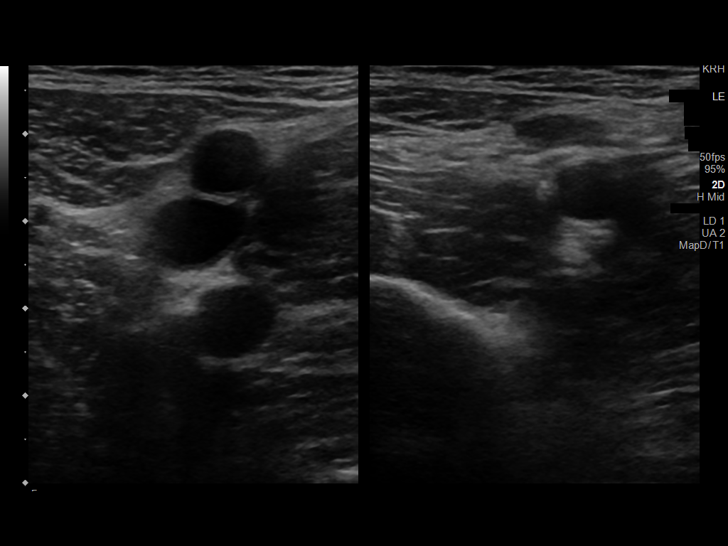
[im 12/30]
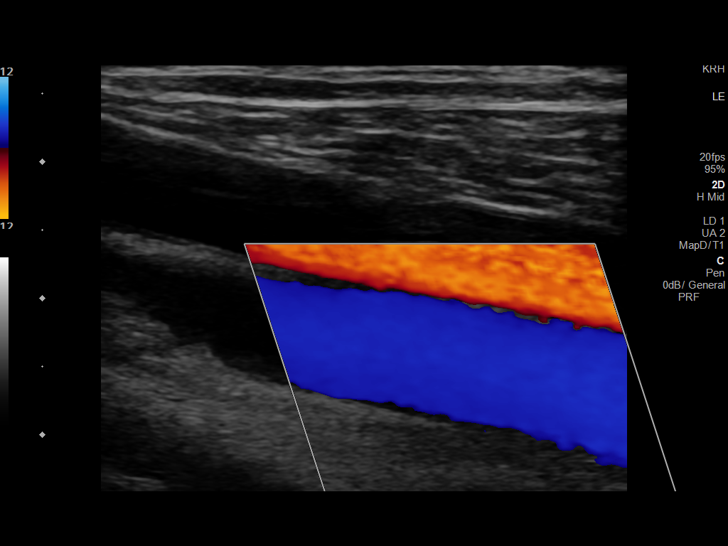
[im 14/30]
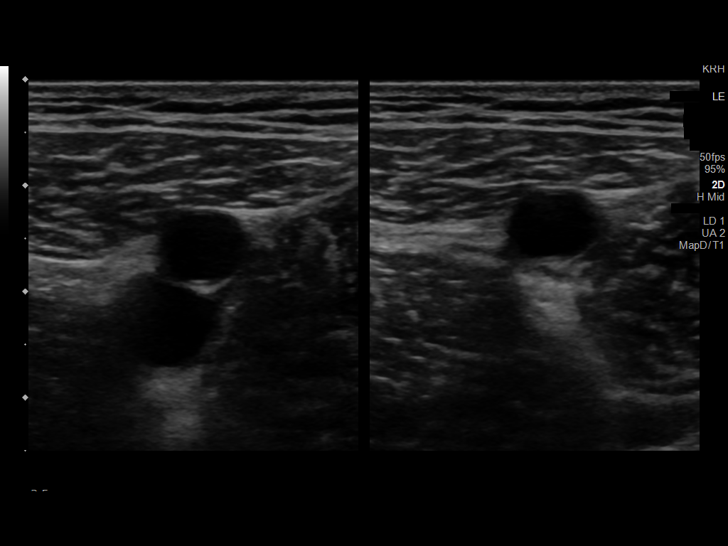
[im 16/30]
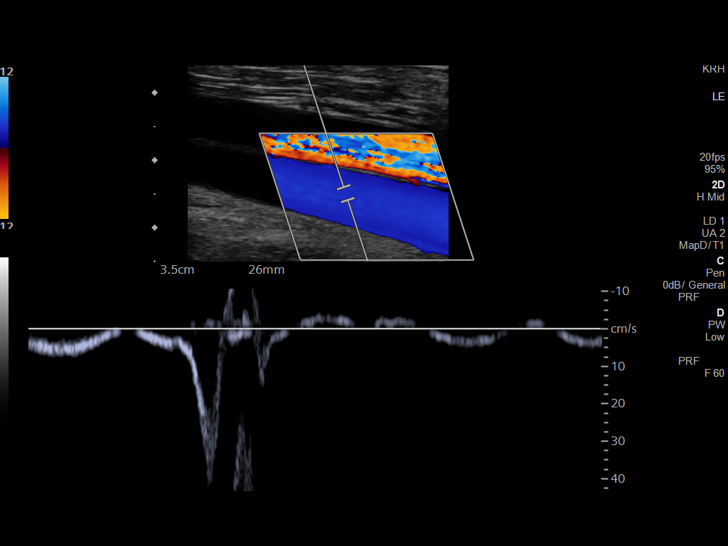
[im 18/30]
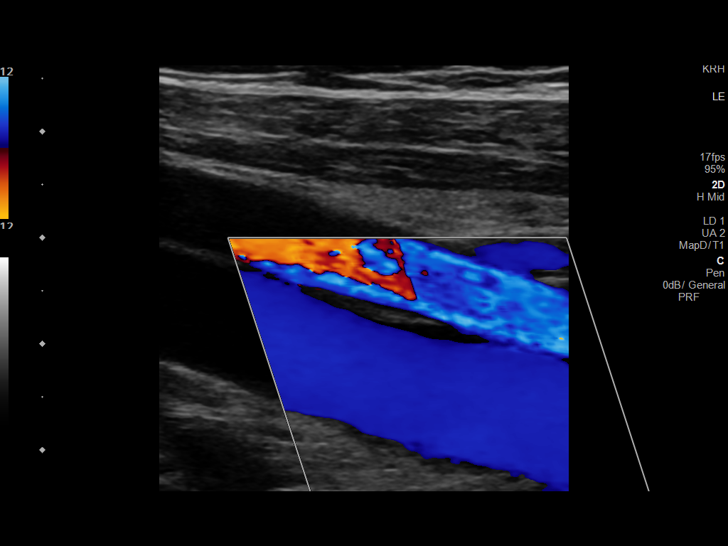
[im 21/30]
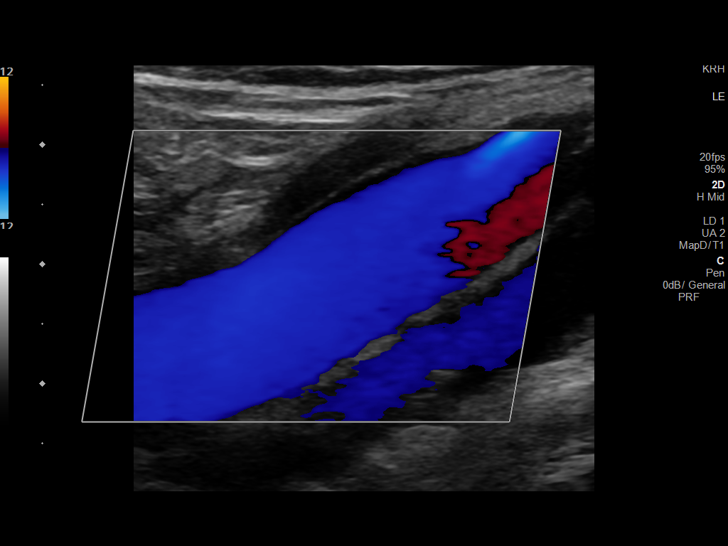
[im 23/30]
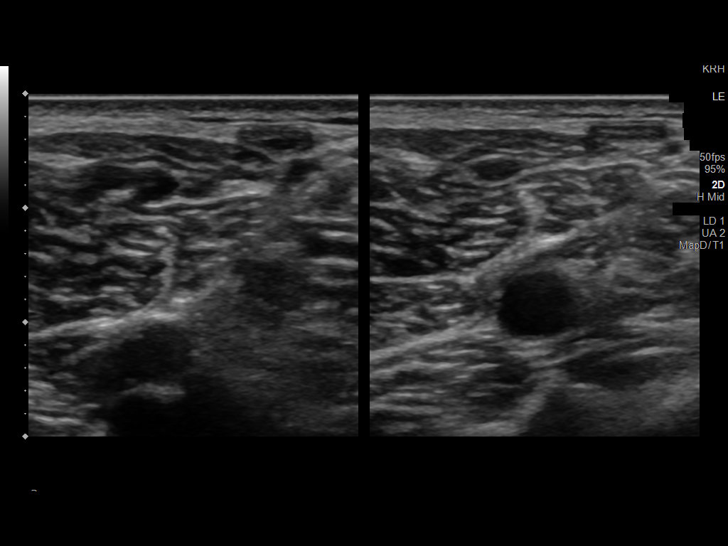
[im 24/30]
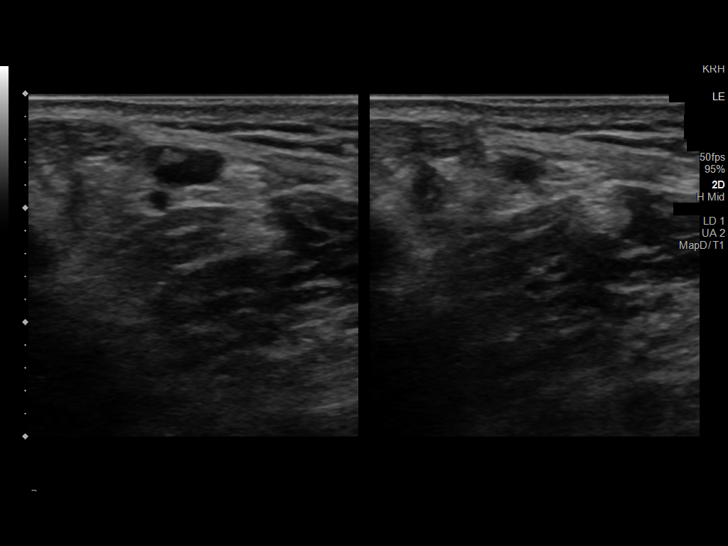
[im 27/30]
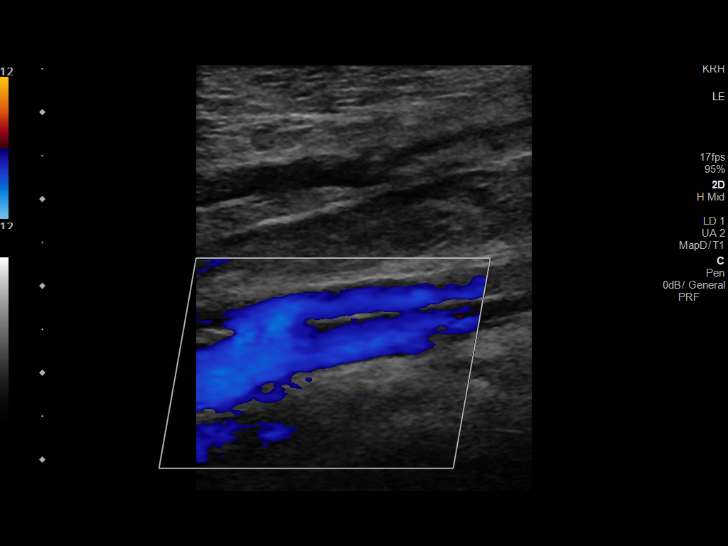
[im 30/30]
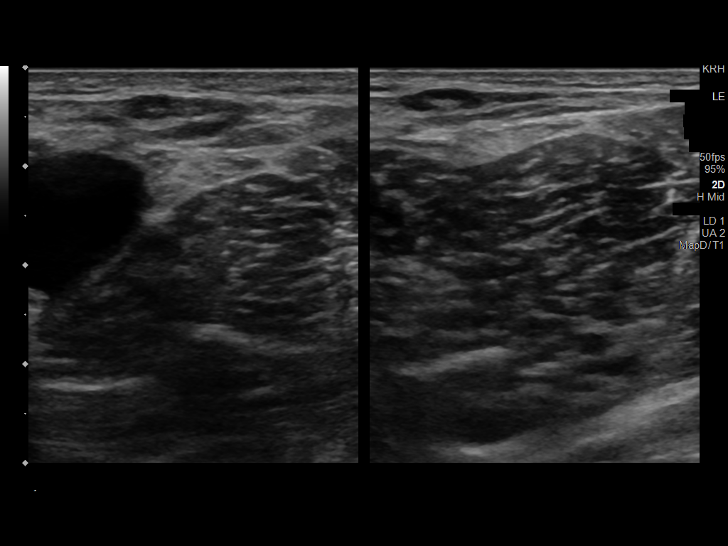

[14 of 24 positions shown; findings below may reference images not displayed]

FINDINGS: VENOUS

Normal compressibility of the common femoral, superficial femoral,
and popliteal veins, as well as the visualized calf veins.
Visualized portions of profunda femoral vein and great saphenous
vein unremarkable. No filling defects to suggest DVT on grayscale or
color Doppler imaging. Doppler waveforms show normal direction of
venous flow, normal respiratory plasticity and response to
augmentation.

Limited views of the contralateral common femoral vein are
unremarkable.

OTHER

None.

Limitations: none
IMPRESSION: Negative venous Doppler examination for right lower extremity deep
venous thrombosis.

## 2020-01-28 MED ORDER — KETOROLAC TROMETHAMINE 60 MG/2ML IM SOLN
60.0000 mg | Freq: Once | INTRAMUSCULAR | Status: AC
  Administered 2020-01-28: 60 mg via INTRAMUSCULAR
  Filled 2020-01-28: qty 2

## 2020-01-28 NOTE — ED Triage Notes (Signed)
R thigh pain since Thursday. States he sits a lot at work and was referred to r/o blood clot.

## 2020-01-28 NOTE — ED Notes (Signed)
Pt not in room.

## 2020-01-28 NOTE — Discharge Instructions (Addendum)
Your ultrasound study here in the ED was negative for DVT.  Suspect that this is a musculoskeletal injury.  I recommend ibuprofen 600 mg every 6 hours as needed for pain symptoms.  Please follow-up the primary care provider for ongoing evaluation and management.  He may also benefit from follow-up with the orthopedist should your symptoms fail to improve with conservative treatment.  Return to the ED or seek immediate medical attention should you experience any new or worsening symptoms.

## 2020-01-28 NOTE — ED Notes (Signed)
Pt discharged to home. Discharge instructions have been discussed with patient and/or family members. Pt verbally acknowledges understanding d/c instructions, and endorses comprehension to checkout at registration before leaving.  °

## 2020-01-28 NOTE — ED Provider Notes (Signed)
[facility] EMERGENCY DEPARTMENT Provider Note   CSN: [id number] Arrival date & time: 01/28/20  1227     History Chief Complaint  Patient presents with  . Leg Pain    Oscar Henry is a 34 y.o. male with PMH significant for polyarthralgias and osteochondral lesions involving right leg followed by orthopedist, Dr. Rush Farmer, who presents to the ED with a 3-day history of atraumatic right thigh pain and concern for DVT.  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 He developed this atraumatic medial right thigh discomfort on Thursday and feels as though it has gotten larger and is "spreading". He is followed by Dr. Lorin Mercy, primary care, as well as Dr. Ninfa Linden, orthopedics. He spoke with a telehealth professional who expressed concern for DVT. He also spoke with his friend who is a resident at [facility] who also was concerned for DVT. He is here for DVT. He denies any chest pain or shortness of breath, fevers or chills, history of clots or clotting disorder, hormone replacement therapy, numbness or weakness, or other focal neurologic deficits. He is able to ambulate, albeit with mild antalgia.  HPI     Past Medical History:  Diagnosis Date  . Fibroma of right lower leg 2004    There are no problems to display for this patient.   Past Surgical History:  Procedure Laterality Date  . LAPAROSCOPIC APPENDECTOMY         No family history on file.  Social History   Tobacco Use  . Smoking status: Never Smoker  . Smokeless tobacco: Never Used  Substance Use Topics  . Alcohol use: Yes  . Drug use: No    Home Medications Prior to Admission medications   Not on File    Allergies    Patient has no known allergies.  Review of Systems   Review of Systems  Constitutional: Negative for fever.  Respiratory: Negative for shortness of breath.   Cardiovascular:  Negative for chest pain and palpitations.  Musculoskeletal: Positive for gait problem. Negative for arthralgias.  Neurological: Negative for weakness and numbness.  Hematological: Does not bruise/bleed easily.    Physical Exam Updated Vital Signs BP 119/85 (BP Location: Left Arm)   Pulse 69   Temp 98.7 F (37.1 C) (Oral)   Resp 18   Ht 6\' 2"  (1.88 m)   Wt 95.3 kg   SpO2 100%   BMI 26.96 kg/m   Physical Exam Vitals and nursing note reviewed. Exam conducted with a chaperone present.  Constitutional:      General: He is not in acute distress.    Appearance: Normal appearance. He is not ill-appearing.     Comments: Athletic.  HENT:     Head: Normocephalic and atraumatic.  Eyes:     General: No scleral icterus.    Conjunctiva/sclera: Conjunctivae normal.  Cardiovascular:     Rate and Rhythm: Normal rate.     Pulses: Normal pulses.  Pulmonary:     Effort: Pulmonary effort is normal. No respiratory distress.  Musculoskeletal:     Comments: Right leg: Pain over medial thigh. No significant swelling, erythema, warmth, or other overlying skin changes. No induration. Hip, knee, and ankle ROM fully intact with strength intact against resistance. Pedal pulses intact and symmetric. Sensation intact throughout.  Skin:    General: Skin is dry.     Capillary Refill: Capillary refill takes  less than 2 seconds.  Neurological:     General: No focal deficit present.     Mental Status: He is alert and oriented to person, place, and time.     GCS: GCS eye subscore is 4. GCS verbal subscore is 5. GCS motor subscore is 6.  Psychiatric:        Mood and Affect: Mood normal.        Behavior: Behavior normal.        Thought Content: Thought content normal.     ED Results / Procedures / Treatments   Labs (all labs ordered are listed, but only abnormal results are displayed) Labs Reviewed - No data to display  EKG None  Radiology US Venous Img Lower Unilateral Right  Result Date:  01/28/2020 CLINICAL DATA:  Right medial thigh pain for 2-3 days. EXAM: Right LOWER EXTREMITY VENOUS DOPPLER ULTRASOUND TECHNIQUE: Gray-scale sonography with compression, as well as color and duplex ultrasound, were performed to evaluate the deep venous system(s) from the level of the common femoral vein through the popliteal and proximal calf veins. COMPARISON:  None. FINDINGS: VENOUS Normal compressibility of the common femoral, superficial femoral, and popliteal veins, as well as the visualized calf veins. Visualized portions of profunda femoral vein and great saphenous vein unremarkable. No filling defects to suggest DVT on grayscale or color Doppler imaging. Doppler waveforms show normal direction of venous flow, normal respiratory plasticity and response to augmentation. Limited views of the contralateral common femoral vein are unremarkable. OTHER None. Limitations: none IMPRESSION: Negative venous Doppler examination for right lower extremity deep venous thrombosis. Electronically Signed   By: Marijo Sanes M.D.   On: 01/28/2020 15:31    Procedures Procedures (including critical care time)  Medications Ordered in ED Medications  ketorolac (TORADOL) injection 60 mg (has no administration in time range)    ED Course  I have reviewed the triage vital signs and the nursing notes.  Pertinent labs & imaging results that were available during my care of the patient were reviewed by me and considered in my medical decision making (see chart for details).    MDM Rules/Calculators/A&P                          Patient presents the ED for DVT rule out. While his exam is largely unremarkable, he does endorse a history of recent surgery and occupation as a Administrator. He is athletic appearing and states that he works out regularly, as recently as morning. However, he also feels as though he knows his body and does not suspect that this is musculoskeletal. I informed him that based on his description  of symptoms, if DVT study is negative this is likely a musculoskeletal issue. Will recommend ibuprofen and follow-up with his primary care provider. Also encouraged increased oral hydration in case this is happening due to dehydration. He is eating and drinking regularly and I have lower suspicion for electrolyte derangement as cause for his symptoms.  US venous imaging of lower unilateral extremity was negative for DVT or other acute pathology.  Patient was very reassured.  Will administer Toradol IM here in the ED.  Will recommend continued ibuprofen 600 mg every 6 hours as needed for pain.  Also encouraging increased oral hydration and continued regular meals in case this is a hydration or electrolyte related etiology for his cramping/discomfort.  He is neurovascular intact with symmetric pedal pulses, low suspicion for arterial thrombus.  No significant  swelling.  Reassuring exam.  Patient to follow-up with his primary care provider for ongoing evaluation and management.  ED return precautions discussed.  Patient voices understanding is agreeable.  Final Clinical Impression(s) / ED Diagnoses Final diagnoses:  Right leg pain    Rx / DC Orders ED Discharge Orders    None       Corena Herter, PA-C 01/28/20 1620    Dorie Rank, MD 01/29/20 [phone]

## 2020-07-19 ENCOUNTER — Encounter: Payer: Self-pay | Admitting: Orthopaedic Surgery

## 2020-07-19 NOTE — Telephone Encounter (Signed)
Does patient need ROV?  I would need surgery sheets with codes to move forward.

## 2020-07-31 ENCOUNTER — Ambulatory Visit: Payer: 59 | Admitting: Orthopaedic Surgery

## 2020-07-31 ENCOUNTER — Encounter: Payer: Self-pay | Admitting: Orthopaedic Surgery

## 2020-07-31 ENCOUNTER — Ambulatory Visit: Payer: Self-pay

## 2020-07-31 DIAGNOSIS — M19021 Primary osteoarthritis, right elbow: Secondary | ICD-10-CM | POA: Diagnosis not present

## 2020-07-31 DIAGNOSIS — M6751 Plica syndrome, right knee: Secondary | ICD-10-CM | POA: Diagnosis not present

## 2020-07-31 NOTE — Progress Notes (Signed)
Office Visit Note   Patient: Oscar Henry           Date of Birth: 22-Sep-1985           MRN: [id number] Visit Date: 07/31/2020              Requested by: Josetta Huddle, MD [address] [place],  [place] [address] PCP: Josetta Huddle, MD   Assessment & Plan: Visit Diagnoses:  1. Plica syndrome of knee, right   2. Primary osteoarthritis of right elbow     Plan:  In regards to his right elbow we will send him for a cortisone injection under ultrasound with Dr. Junius Roads.  Discussed right knee arthroscopy with debridement of plica with him today.  He like to proceed with his near future.  Risk benefits surgery discussed with him.  Questions encouraged and answered.  We will see him 1 week postop    Follow-Up Instructions: Return 1 week post op.   Orders:  No orders of the defined types were placed in this encounter.  No orders of the defined types were placed in this encounter.     Procedures: No procedures performed   Clinical Data: No additional findings.   Subjective: Chief Complaint  Patient presents with   Right Knee - Pain, Follow-up   Right Elbow - Pain, Follow-up    HPI Patient returns today with continued right knee and right elbow pain.  He has had no known injury to the elbow or the right knee since last visit.  Again MRI of the right elbow showed partial to full-thickness cartilage loss involving the radiocapitellar joint and also involving the ulnohumeral joint.  No loose bodies.  He states he is having slightly increased pain in the elbow.  He would like to try a steroid injection in the elbow if possible. In regards to his right knee MRI did show an osteochondral lesion of the medial facet of the patella interventricular groove as well as a medial plica that is thickened and synovitis in this region.  He continues to have pain in the knee.  He has pain going up and down stairs.  He states occasional giving way of the knee.  States the injection  in the knee did not give him any real relief.  He has had to postpone surgery of his knee due to the fact he had to have jaw surgery.  Review of Systems See HPI  Objective: Vital Signs: There were no vitals taken for this visit.  Physical Exam Constitutional:      Appearance: He is not ill-appearing or diaphoretic.  Pulmonary:     Effort: Pulmonary effort is normal.  Neurological:     Mental Status: He is alert and oriented to person, place, and time.  Psychiatric:        Behavior: Behavior normal.    Ortho Exam Right elbow overall good range of motion the elbow with some slight stiffness. Right knee full extension slight hyperextension.  Patellofemoral crepitus with passive range of motion.  Thickening superior medial knee thickening consistent with plica.  No instability valgus varus stressing right knee. Specialty Comments:  No specialty comments available.  Imaging: No results found.   PMFS History: There are no problems to display for this patient.  Past Medical History:  Diagnosis Date   Fibroma of right lower leg 2004    History reviewed. No pertinent family history.  Past Surgical History:  Procedure Laterality Date   LAPAROSCOPIC APPENDECTOMY  Social History   Occupational History   Not on file  Tobacco Use   Smoking status: Never   Smokeless tobacco: Never  Substance and Sexual Activity   Alcohol use: Yes   Drug use: No   Sexual activity: Not on file

## 2020-07-31 NOTE — Patient Instructions (Signed)
   Glucosamine sulfate:  1,000 mg twice daily  Turmeric:  500 mg twice daily

## 2020-07-31 NOTE — Progress Notes (Signed)
Subjective: He is here for ultrasound-guided right elbow injection.  He has DJD.  Pain on the lateral aspect.  Objective: Small effusion with no warmth or erythema.  Procedure: Ultrasound-guided injection: After sterile prep with Betadine, injected 3 cc 0.25% bupivacaine and 3 mg betamethasone into the lateral joint recess.  He will try glucosamine and turmeric as well.

## 2020-08-09 ENCOUNTER — Other Ambulatory Visit: Payer: Self-pay | Admitting: Orthopaedic Surgery

## 2020-08-09 DIAGNOSIS — M6751 Plica syndrome, right knee: Secondary | ICD-10-CM

## 2020-08-09 MED ORDER — HYDROCODONE-ACETAMINOPHEN 5-325 MG PO TABS: 1.0000 | ORAL_TABLET | Freq: Four times a day (QID) | ORAL | 0 refills | Status: DC | PRN

## 2020-08-21 ENCOUNTER — Encounter: Payer: 59 | Admitting: Family

## 2020-08-27 ENCOUNTER — Encounter: Payer: 59 | Admitting: Physician Assistant

## 2020-08-28 ENCOUNTER — Encounter: Payer: Self-pay | Admitting: Physician Assistant

## 2020-08-28 ENCOUNTER — Ambulatory Visit (INDEPENDENT_AMBULATORY_CARE_PROVIDER_SITE_OTHER): Payer: 59 | Admitting: Physician Assistant

## 2020-08-28 ENCOUNTER — Ambulatory Visit: Payer: Self-pay

## 2020-08-28 DIAGNOSIS — M5442 Lumbago with sciatica, left side: Secondary | ICD-10-CM

## 2020-08-28 MED ORDER — PREDNISONE 10 MG PO TABS: 20.0000 mg | ORAL_TABLET | Freq: Every day | ORAL | 0 refills | Status: DC

## 2020-08-28 NOTE — Progress Notes (Signed)
Office Visit Note   Patient: Oscar Henry           Date of Birth: 1985-03-20           MRN: [id number] Visit Date: 08/28/2020              Requested by: Josetta Huddle, MD [address] [place],  [place] [address] PCP: Josetta Huddle, MD  No chief complaint on file.     HPI: She is a pleasant active 35 year old gentleman who is 2 weeks status post knee arthroscopy with Dr. Ninfa Linden.  With regards to this he is actually doing quite well.  He also had a steroid injection into his elbow with Dr. Junius Roads he does not think this provided him with much relief.  His biggest concern today is of recurrent lower back pain.  Normally this runs down his right leg but currently he is having radicular symptoms that are new but have been persistence since before his knee surgery that run down his left leg with associated paresthesias in his knee.  He said he has a remote history of an MRI several years ago that had some degenerative changes from what he remembers at the L5-S1 level.  He describes the pain as in his lower back and goes down into his hips and into the inside of his left thigh  Assessment & Plan: Visit Diagnoses:  1. Acute low back pain with left-sided sciatica, unspecified back pain laterality     Plan: Given he has had symptoms that are progressive since before his knee surgery he would like to go forward with an MRI.  He will follow-up with Dr. Ernestina Patches once this is completed.  We will start him on 20 mg of prednisone with breakfast may be a candidate for epidural steroid injection x-ray findings suggest impingement at the L1-2 level which coordinate with some of his symptoms  Follow-Up Instructions: No follow-ups on file.   Ortho Exam  Patient is alert, oriented, no adenopathy, well-dressed, normal affect, normal respiratory effort. Examination of his right knee well-healed surgical portals no swelling no effusion slight quadricep atrophy compared to his other side.   Examination has some lower back pain that traces down his left thigh and has some paresthesia changes in his medial side of his knee.  Has good dorsiflexion plantarflexion strength.  Has pulling with straight leg raise  Imaging: No results found. No images are attached to the encounter.  Labs: No results found for: HGBA1C, ESRSEDRATE, CRP, LABURIC, REPTSTATUS, GRAMSTAIN, CULT, LABORGA   No results found for: ALBUMIN, PREALBUMIN, CBC  No results found for: MG No results found for: VD25OH  No results found for: PREALBUMIN No flowsheet data found.   There is no height or weight on file to calculate BMI.  Orders:  Orders Placed This Encounter  Procedures   XR Lumbar Spine 2-3 Views   MR Lumbar Spine w/o contrast   Ambulatory referral to Physical Medicine Rehab   No orders of the defined types were placed in this encounter.    Procedures: No procedures performed  Clinical Data: No additional findings.  ROS:  All other systems negative, except as noted in the HPI. Review of Systems  Objective: Vital Signs: There were no vitals taken for this visit.  Specialty Comments:  No specialty comments available.  PMFS History: There are no problems to display for this patient.  Past Medical History:  Diagnosis Date   Fibroma of right lower leg 2004  No family history on file.  Past Surgical History:  Procedure Laterality Date   LAPAROSCOPIC APPENDECTOMY     Social History   Occupational History   Not on file  Tobacco Use   Smoking status: Never   Smokeless tobacco: Never  Substance and Sexual Activity   Alcohol use: Yes   Drug use: No   Sexual activity: Not on file

## 2020-08-28 NOTE — Progress Notes (Signed)
Xr lumbar

## 2020-09-04 ENCOUNTER — Other Ambulatory Visit: Payer: 59

## 2020-10-09 ENCOUNTER — Ambulatory Visit
Admission: RE | Admit: 2020-10-09 | Discharge: 2020-10-09 | Disposition: A | Discharge status: Home / Self Care | Payer: 59 | Source: Ambulatory Visit | Attending: Physician Assistant | Admitting: Physician Assistant

## 2020-10-09 ENCOUNTER — Other Ambulatory Visit: Payer: Self-pay

## 2020-10-09 DIAGNOSIS — M5442 Lumbago with sciatica, left side: Secondary | ICD-10-CM

## 2020-10-09 IMAGING — MR MR LUMBAR SPINE W/O CM
5 series · 32 of 48 positions shown · non-contrast
Comparison: Lumbar spine MRI [DATE], a [DATE] lumbar spine
MRI is not available for comparison. Correlation is also made with
lumbar spine radiographs [DATE]

CLINICAL DATA: Lumbar radiculopathy

EXAM:
MRI LUMBAR SPINE WITHOUT CONTRAST
TECHNIQUE: Multiplanar, multisequence MR imaging of the lumbar spine was
performed. No intravenous contrast was administered.

[Series 3: T2 · sagittal · 4.0mm · 1.09mm/px · 6 of 17 slices shown (1 of 2)]
[im 1/17]
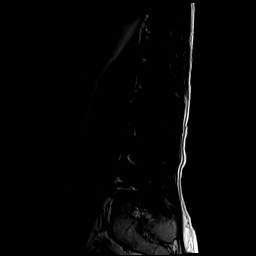
[im 4/17]
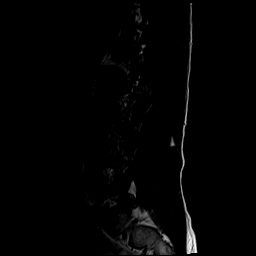
[im 7/17]
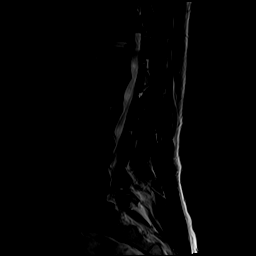
[im 10/17]
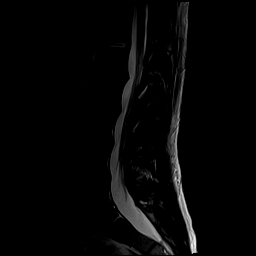
[im 13/17]
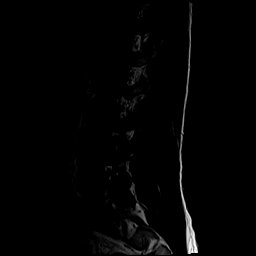
[im 17/17]
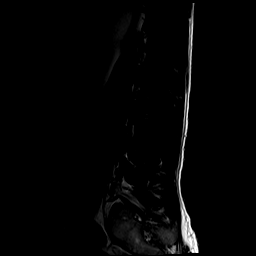

[Series 4: STIR · sagittal · 4.0mm · 1.09mm/px · 2 of 17 slices shown]
[im 1/17]
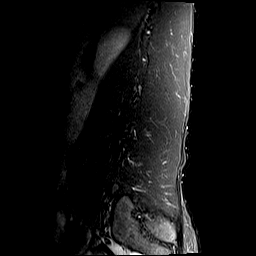
[im 4/17]
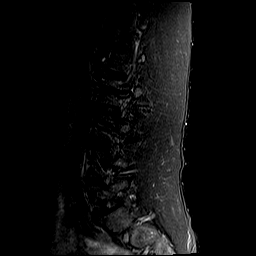

[Series 5: T1 · sagittal · 4.0mm · 1.09mm/px · 6 of 17 slices shown (1 of 2)]
[im 1/17]
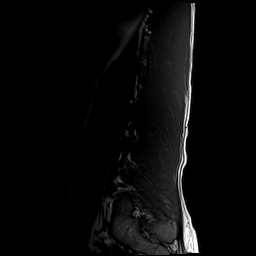
[im 4/17]
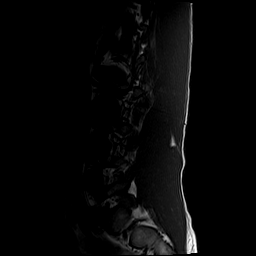
[im 7/17]
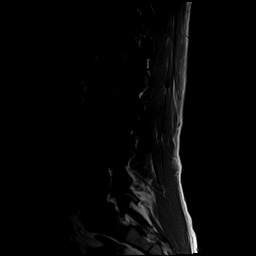
[im 10/17]
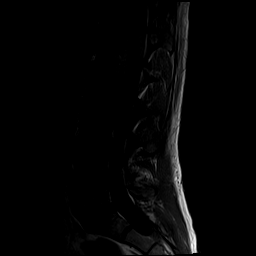
[im 13/17]
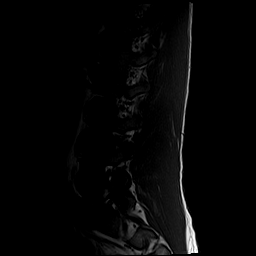
[im 17/17]
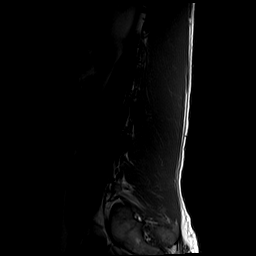

[Series 6: T2 · axial · 4.0mm · 0.39mm/px · z∈[-194,+30]mm · 9 of 44 slices shown (2 of 2)]
[im 1/44]
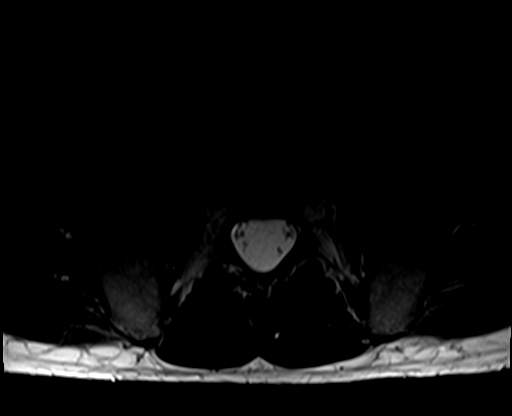
[im 7/44]
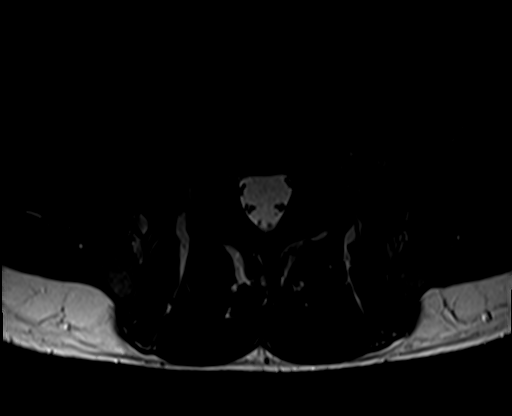
[im 13/44]
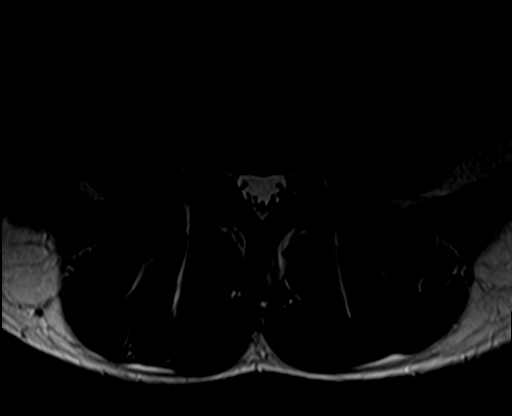
[im 19/44]
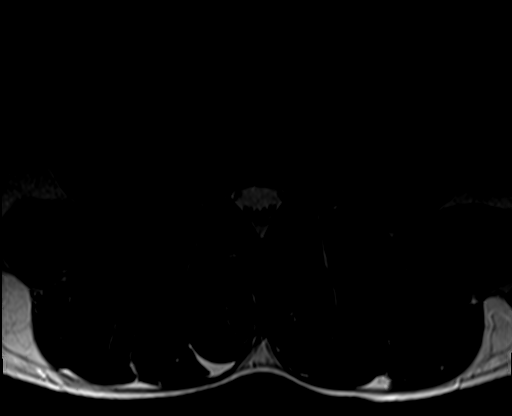
[im 22/44]
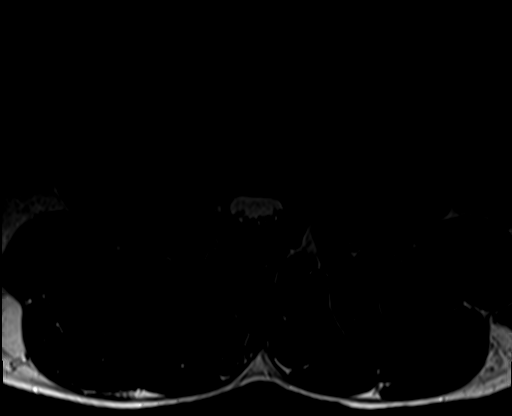
[im 25/44]
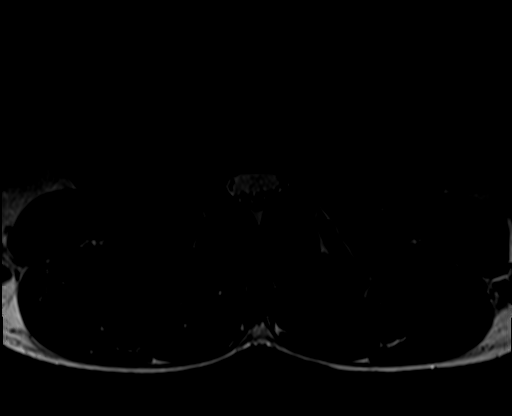
[im 31/44]
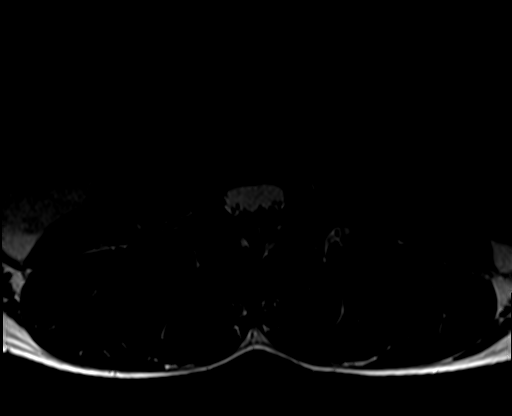
[im 37/44]
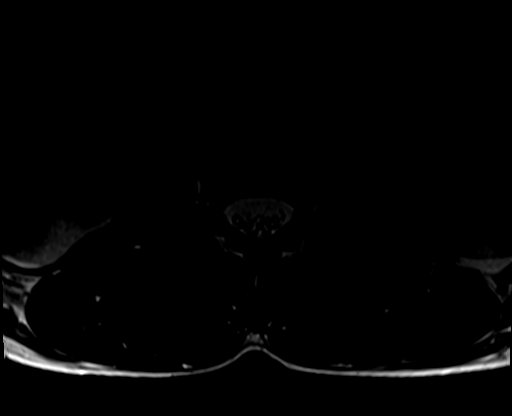
[im 44/44]
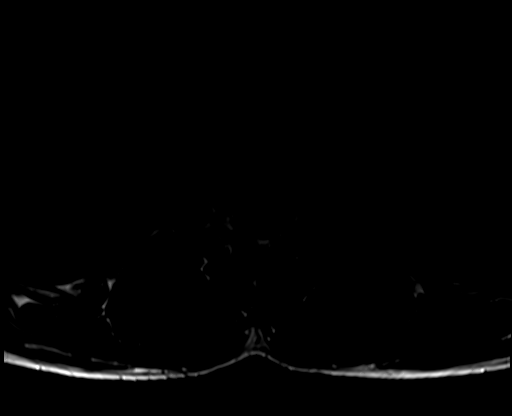

[Series 7: T1 · axial · 4.0mm · 0.39mm/px · z∈[-194,+30]mm · 9 of 44 slices shown (2 of 2)]
[im 1/44]
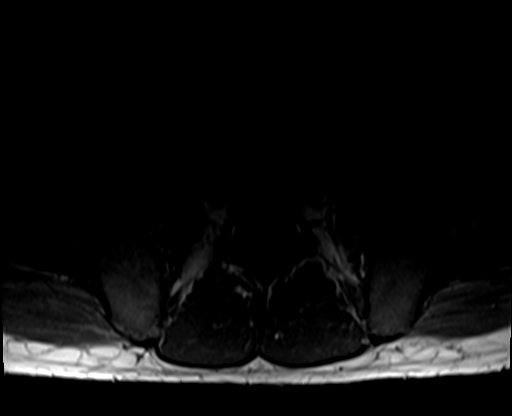
[im 7/44]
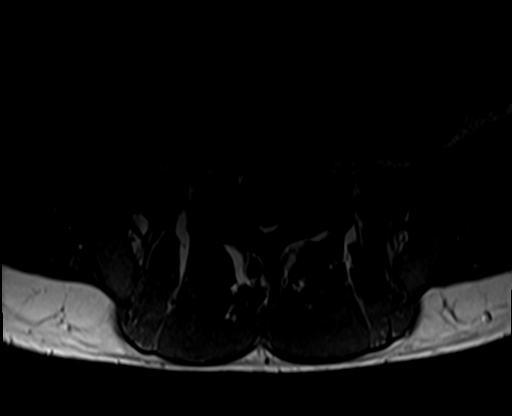
[im 13/44]
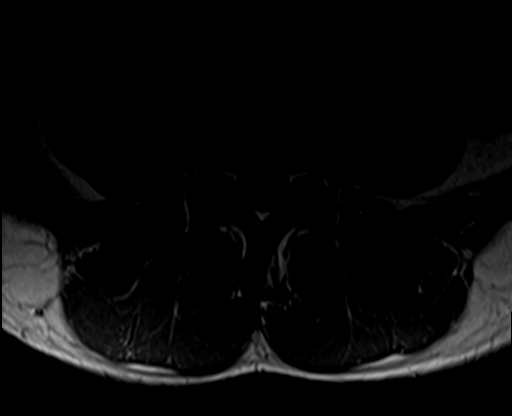
[im 19/44]
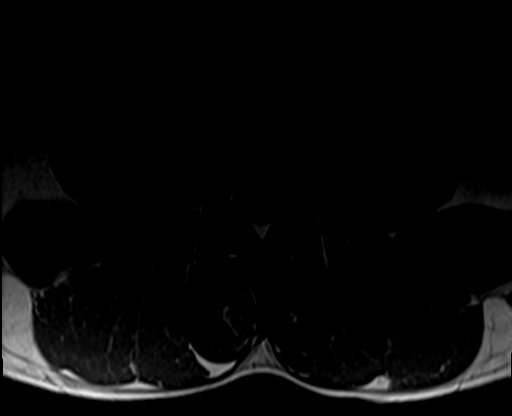
[im 22/44]
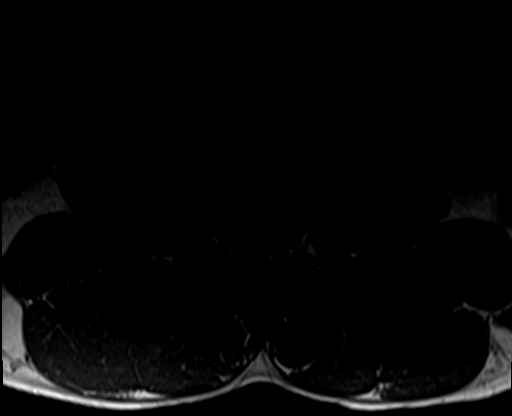
[im 25/44]
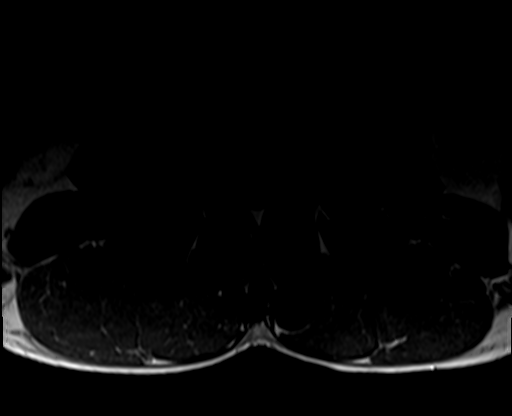
[im 31/44]
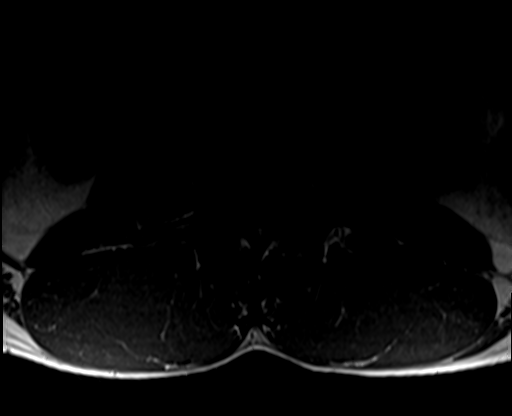
[im 37/44]
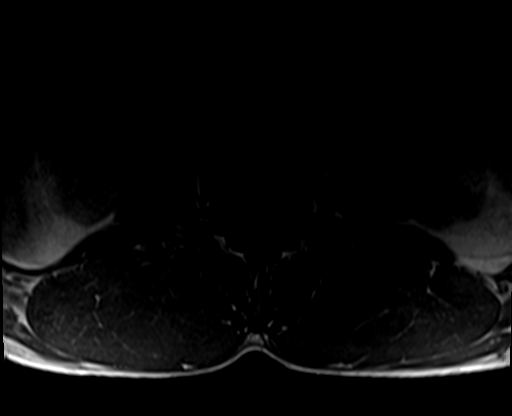
[im 44/44]
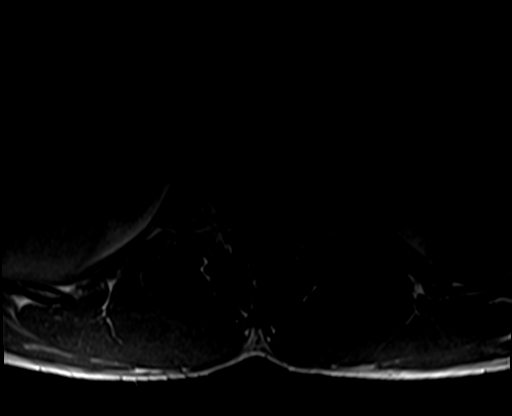

[32 of 48 positions shown; findings below may reference images not displayed]

FINDINGS: Segmentation:  5 lumbar type vertebral bodies.

Alignment:  Mild dextrocurvature of the lumbar spine.

Vertebrae:  No fracture, evidence of discitis, or bone lesion.

Conus medullaris and cauda equina: Conus extends to the L1 level.
Conus and cauda equina appear normal.

Paraspinal and other soft tissues: Negative.

Disc levels:

T12-L1: No significant disc bulge. No spinal canal stenosis or
neural foraminal narrowing.

L1-L2: Disc desiccation and disc height loss, which is new compared
to [NR]. Mild disc bulge. Mild facet arthropathy. No spinal canal
stenosis or neural foraminal narrowing.

L2-L3: Mild disc desiccation and minimal height loss. No significant
disc bulge. Mild facet arthropathy. No spinal canal stenosis or
neural foraminal narrowing.

L3-L4: Left foraminal and extreme lateral disc protrusion. Mild
facet arthropathy. No spinal canal stenosis. Moderate left and mild
right neural foraminal narrowing.

L4-L5: Unchanged disc desiccation and mild disc bulge with central
annular fissure. Mild facet arthropathy. No spinal canal stenosis.
No neural foraminal narrowing.

L5-S1: Unchanged disc desiccation with central disc bulge with
annular fissure. Mild right-greater-than-left facet arthropathy. No
spinal canal stenosis. Mild left-greater-than-right neural foraminal
narrowing.
IMPRESSION: 1. Progression of previously noted degenerative disc disease
compared to [NR], most prominently at L1-L2.
2. L3-L4 moderate left and mild right neural foraminal narrowing.
3. L5-S1 mild left-greater-than-right neural foraminal narrowing.

## 2020-10-16 ENCOUNTER — Ambulatory Visit: Payer: 59 | Admitting: Physical Medicine and Rehabilitation

## 2020-10-16 ENCOUNTER — Encounter: Payer: Self-pay | Admitting: Physical Medicine and Rehabilitation

## 2020-10-16 ENCOUNTER — Telehealth: Payer: Self-pay | Admitting: Physical Medicine and Rehabilitation

## 2020-10-16 ENCOUNTER — Other Ambulatory Visit: Payer: Self-pay

## 2020-10-16 VITALS — BP 125/79 | HR 67

## 2020-10-16 DIAGNOSIS — M5416 Radiculopathy, lumbar region: Secondary | ICD-10-CM | POA: Diagnosis not present

## 2020-10-16 DIAGNOSIS — M5442 Lumbago with sciatica, left side: Secondary | ICD-10-CM

## 2020-10-16 DIAGNOSIS — R202 Paresthesia of skin: Secondary | ICD-10-CM

## 2020-10-16 DIAGNOSIS — M47816 Spondylosis without myelopathy or radiculopathy, lumbar region: Secondary | ICD-10-CM

## 2020-10-16 DIAGNOSIS — G8929 Other chronic pain: Secondary | ICD-10-CM

## 2020-10-16 NOTE — Telephone Encounter (Signed)
Is auth needed for L4-5 IL? Scheduled for 9/12.

## 2020-10-16 NOTE — Progress Notes (Signed)
Pt state lower back that travels down his left leg. Pt state bending makes the pain worse. Pt state he right knee surgery and he believe he twitch something. Pt state his left leg feels weak like when you hit your funny bone. Pt state he takes pain meds to help ease pain.  Numeric Pain Rating Scale and Functional Assessment Average Pain 6 Pain Right Now 3 My pain is constant, burning, dull, tingling, and aching Pain is worse with: bending and some activites Pain improves with: medication   In the last MONTH (on 0-10 scale) has pain interfered with the following?  1. General activity like being  able to carry out your everyday physical activities such as walking, climbing stairs, carrying groceries, or moving a chair?  Rating(4)  2. Relation with others like being able to carry out your usual social activities and roles such as  activities at home, at work and in your community. Rating(5)  3. Enjoyment of life such that you have  been bothered by emotional problems such as feeling anxious, depressed or irritable?  Rating(6)

## 2020-10-17 ENCOUNTER — Encounter: Payer: Self-pay | Admitting: Physical Medicine and Rehabilitation

## 2020-10-17 DIAGNOSIS — M25569 Pain in unspecified knee: Secondary | ICD-10-CM | POA: Insufficient documentation

## 2020-10-17 DIAGNOSIS — J329 Chronic sinusitis, unspecified: Secondary | ICD-10-CM | POA: Insufficient documentation

## 2020-10-17 NOTE — Progress Notes (Signed)
Oscar Henry - 35 y.o. male MRN [id number]  Date of birth: 1985/02/21  Office Visit Note: Visit Date: 10/16/2020 PCP: Josetta Huddle, MD Referred by: Josetta Huddle, MD  Subjective: Chief Complaint  Patient presents with   Lower Back - Pain   Right Knee - Pain   Left Leg - Pain, Weakness   HPI: Oscar Henry is a 35 y.o. male who comes in today At the request of [place] Persons, PA-C for evaluation and management of chronic history of intermittent severe mostly right-sided low back pain and hip pain but now with several weeks of left radicular type leg pain.  He is typically followed by Dr. Jean Rosenthal in our office with complaints of knee and shoulder pain and is status post more recent knee surgery.  Patient has a history of intermittent severe back pain which would ultimately resolve to some degree with medication and rest.  He actively works out and Oscar Henry.  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  He reports that he typically has right lower back and buttock pain not really down the leg on the right side but now is began having left radicular complaints and he indicates a distribution that is a pretty classic L4 distribution down to the medial side of the foot.  He does endorse some paresthesia type sensation.  He reports to me a history of problems with degenerative disc changes.  I did try to reassure them that the disc itself is not a painful structure and they do have degenerative changes throughout life and that is usually not necessarily an issue unless there is multiple areas and we get some collapse of the spine.  He said he had a prior MRI showing some of those changes.  He said that he had a former doctor just tell him it was chronic back pain.  He reports average pain of 6 out of 10 pain across the lower back somewhat right more than left but this left leg pain with burning dull tingling and aching sensation medially.  Is worse  with bending and prolonged sitting and some activities.  Seems to be better with anti-inflammatory and rest.  He did have some hydrocodone around the knee surgery.  Not really taking any opioid medication at this point.  Oscar Palmer Persons, PA-C did order an updated MRI given his new symptoms.  This is reviewed with him today using spine models and imaging.  He feels like his thighs get weak and he feels some weakness with prolonged running but has had no focal weakness or foot drop.  No bowel or bladder changes etc.  No night pain.  No specific unintended weight loss etc.  No symptoms in the right leg itself.  No history of prior lumbar surgery.  Not specific physical therapy at this point although he did have some around the time of his knee in the past and he has had off-and-on work with core strengthening and is an avid person who exercises and weight lifts.  Review of Systems  Musculoskeletal:  Positive for back pain and joint pain.  Neurological:  Positive for tingling.  All other systems reviewed and are negative. Otherwise per HPI.  Assessment & Plan: Visit Diagnoses:    ICD-10-CM   1. Lumbar radiculopathy  M54.16     2. Paresthesia of skin  R20.2     3. Chronic right-sided low back pain with left-sided sciatica  M54.42  G89.29     4. Spondylosis without myelopathy or radiculopathy, lumbar region  M47.816        Plan: Findings:  Chronic intermittent history of back pain right more than left traditionally but now with newer onset of left radicular leg pain in an L4 distribution with paresthesia.  He reports his back and leg pain are pretty much equal but the leg pain is new.  He has tried and failed activity modification and rest from certain exercises as well as anti-inflammatories and muscle relaxers without much relief at all.  MRI shows left foraminal extraforaminal protrusion at L3-4 but that just does not fit with his pain pattern.  There is actually a slightly less prominent  subarticular to foraminal small protrusion at L4-5 on the left it does fit with his pain pattern.  This was not called out on the report but is clearly next to the descending L4 nerve root.  No frank compression.  He also does have these degenerative changes with annular tears in the L4-5 and L5-S1 disc.  These can definitely be a cause of intermittent what I referred to [place] tweaking the back and these annular tears will hurt for a while and become inflamed and then get better.  This is something we just see and most people throughout life and I try to reassure them of that.  There is nothing overtly surgical going on.  Given these findings I think the best approach is an interlaminar injection on the left at L4-5 this would be diagnostic and hopefully therapeutic.  This would be done with fluoroscopic guidance.  Interlaminar approach to get spread of medication to both sides because he is having some traditional pain on the right.  Depending on his relief could look at more of a focal transforaminal injection.  I do think he would do well with a short course of formal physical therapy to get him on a program of core strengthening etc. even though he is really physically active.  This is clearly a situation where chronic opioid treatment would be warranted.   Meds & Orders: No orders of the defined types were placed in this encounter.  No orders of the defined types were placed in this encounter.   Follow-up: Return for Left L4-5 interlaminar epidural steroid injection.   Procedures: No procedures performed      Clinical History: MRI LUMBAR SPINE WITHOUT CONTRAST   TECHNIQUE: Multiplanar, multisequence MR imaging of the lumbar spine was performed. No intravenous contrast was administered.   COMPARISON:  Lumbar spine MRI 08/25/2011, a 08/02/2014 lumbar spine MRI is not available for comparison. Correlation is also made with lumbar spine radiographs 08/28/2020   FINDINGS: Segmentation:  5 lumbar  type vertebral bodies.   Alignment:  Mild dextrocurvature of the lumbar spine.   Vertebrae:  No fracture, evidence of discitis, or bone lesion.   Conus medullaris and cauda equina: Conus extends to the L1 level. Conus and cauda equina appear normal.   Paraspinal and other soft tissues: Negative.   Disc levels:   T12-L1: No significant disc bulge. No spinal canal stenosis or neural foraminal narrowing.   L1-L2: Disc desiccation and disc height loss, which is new compared to 2013. Mild disc bulge. Mild facet arthropathy. No spinal canal stenosis or neural foraminal narrowing.   L2-L3: Mild disc desiccation and minimal height loss. No significant disc bulge. Mild facet arthropathy. No spinal canal stenosis or neural foraminal narrowing.   L3-L4: Left foraminal and extreme lateral disc protrusion.  Mild facet arthropathy. No spinal canal stenosis. Moderate left and mild right neural foraminal narrowing.   L4-L5: Unchanged disc desiccation and mild disc bulge with central annular fissure. Mild facet arthropathy. No spinal canal stenosis. No neural foraminal narrowing.   L5-S1: Unchanged disc desiccation with central disc bulge with annular fissure. Mild right-greater-than-left facet arthropathy. No spinal canal stenosis. Mild left-greater-than-right neural foraminal narrowing.   IMPRESSION: 1. Progression of previously noted degenerative disc disease compared to 2013, most prominently at L1-L2. 2. L3-L4 moderate left and mild right neural foraminal narrowing. 3. L5-S1 mild left-greater-than-right neural foraminal narrowing.     Electronically Signed   By: Merilyn Baba M.D.   On: 10/09/2020 16:03   He reports that he has never smoked. He has never used smokeless tobacco. No results for input(s): HGBA1C, LABURIC in the last 8760 hours.  Objective:  VS:  HT:    WT:   BMI:     BP:125/79  HR:67bpm  TEMP: ( )  RESP:  Physical Exam Vitals and nursing note reviewed.   Constitutional:      General: He is not in acute distress.    Appearance: Normal appearance. He is not ill-appearing.  HENT:     Head: Normocephalic and atraumatic.     Right Ear: External ear normal.     Left Ear: External ear normal.     Nose: No congestion.  Eyes:     Extraocular Movements: Extraocular movements intact.  Cardiovascular:     Rate and Rhythm: Normal rate.     Pulses: Normal pulses.  Pulmonary:     Effort: Pulmonary effort is normal. No respiratory distress.  Abdominal:     General: There is no distension.     Palpations: Abdomen is soft.  Musculoskeletal:        General: Tenderness present. No signs of injury.     Cervical back: Neck supple.     Right lower leg: No edema.     Left lower leg: No edema.     Comments: Patient has some back pain with extension and facet loading.  He has no pain with hip rotation.  He has some tenderness over the greater trochanters but it is mild.  He has dysesthesias in the left L4 dermatome.  He has good strength in the lower extremities with dorsiflexion and plantarflexion and EHL.  No allodynia or color changes.  He has an equivocal slump test on the left and right with tight hamstrings.  Patient has good distal strength without clonus.  Skin:    Findings: No erythema or rash.  Neurological:     General: No focal deficit present.     Mental Status: He is alert and oriented to person, place, and time.     Sensory: Sensory deficit present.     Motor: No weakness or abnormal muscle tone.     Coordination: Coordination normal.     Gait: Gait normal.  Psychiatric:        Mood and Affect: Mood normal.        Behavior: Behavior normal.    Ortho Exam  Imaging: No results found.  Past Medical/Family/Surgical/Social History: Medications & Allergies reviewed per EMR, new medications updated. Patient Active Problem List   Diagnosis Date Noted   Pain in joint, lower leg 10/17/2020   Sinusitis 10/17/2020   Past Medical History:   Diagnosis Date   Fibroma of right lower leg 2004   History reviewed. No pertinent family history. Past Surgical History:  Procedure  Laterality Date   LAPAROSCOPIC APPENDECTOMY     Social History   Occupational History   Not on file  Tobacco Use   Smoking status: Never   Smokeless tobacco: Never  Substance and Sexual Activity   Alcohol use: Yes   Drug use: No   Sexual activity: Not on file

## 2020-10-29 ENCOUNTER — Ambulatory Visit: Payer: 59 | Admitting: Physical Medicine and Rehabilitation

## 2020-11-07 ENCOUNTER — Ambulatory Visit (INDEPENDENT_AMBULATORY_CARE_PROVIDER_SITE_OTHER): Payer: 59 | Admitting: Physical Medicine and Rehabilitation

## 2020-11-07 ENCOUNTER — Other Ambulatory Visit: Payer: Self-pay

## 2020-11-07 ENCOUNTER — Ambulatory Visit: Payer: Self-pay

## 2020-11-07 ENCOUNTER — Encounter: Payer: Self-pay | Admitting: Physical Medicine and Rehabilitation

## 2020-11-07 VITALS — BP 122/68 | HR 73

## 2020-11-07 DIAGNOSIS — M5416 Radiculopathy, lumbar region: Secondary | ICD-10-CM | POA: Diagnosis not present

## 2020-11-07 MED ORDER — BETAMETHASONE SOD PHOS & ACET 6 (3-3) MG/ML IJ SUSP
12.0000 mg | Freq: Once | INTRAMUSCULAR | Status: AC
  Administered 2020-11-07: 12 mg

## 2020-11-07 NOTE — Progress Notes (Signed)
Pt state lower back pain that travels to right hip and down left leg. Pt state any movement makes the pain worse. Pt state he take pain meds and uses heating to help ease his pain.  Numeric Pain Rating Scale and Functional Assessment Average Pain 7   In the last MONTH (on 0-10 scale) has pain interfered with the following?  1. General activity like being  able to carry out your everyday physical activities such as walking, climbing stairs, carrying groceries, or moving a chair?  Rating(9)   +Driver, -BT, -Dye Allergies.

## 2020-11-07 NOTE — Patient Instructions (Signed)

## 2020-11-16 NOTE — Progress Notes (Signed)
Oscar Henry - 35 y.o. male MRN [id number]  Date of birth: 1985-10-12  Office Visit Note: Visit Date: 11/07/2020 PCP: Josetta Huddle, MD Referred by: Josetta Huddle, MD  Subjective: Chief Complaint  Patient presents with   Lower Back - Pain   Left Leg - Pain   Right Hip - Pain   HPI:  Oscar Henry is a 35 y.o. male who comes in today at the request of Dr. Jean Rosenthal for planned Left L4-5 Lumbar Interlaminar epidural steroid injection with fluoroscopic guidance.  The patient has failed conservative care including home exercise, medications, time and activity modification.  This injection will be diagnostic and hopefully therapeutic.  Please see requesting physician notes for further details and justification. MRI reviewed with images and spine model.  MRI reviewed in the note below.     ROS Otherwise per HPI.  Assessment & Plan: Visit Diagnoses:    ICD-10-CM   1. Lumbar radiculopathy  M54.16 XR C-ARM NO REPORT    Epidural Steroid injection    betamethasone acetate-betamethasone sodium phosphate (CELESTONE) injection 12 mg      Plan: No additional findings.   Meds & Orders:  Meds ordered this encounter  Medications   betamethasone acetate-betamethasone sodium phosphate (CELESTONE) injection 12 mg    Orders Placed This Encounter  Procedures   XR C-ARM NO REPORT   Epidural Steroid injection    Follow-up: Return if symptoms worsen or fail to improve.   Procedures: No procedures performed  Lumbar Epidural Steroid Injection - Interlaminar Approach with Fluoroscopic Guidance  Patient: Oscar Henry      Date of Birth: 06-Sep-1985 MRN: [id number] PCP: Josetta Huddle, MD      Visit Date: 11/07/2020   Universal Protocol:     Consent Given By: the patient  Position: PRONE  Additional Comments: Vital signs were monitored before and after the procedure. Patient was prepped and draped in the usual sterile fashion. The correct patient, procedure, and site was  verified.   Injection Procedure Details:   Procedure diagnoses: Lumbar radiculopathy [M54.16]   Meds Administered:  Meds ordered this encounter  Medications   betamethasone acetate-betamethasone sodium phosphate (CELESTONE) injection 12 mg     Laterality: Left  Location/Site:  L4-5  Needle: 3.5 in., 20 ga. Tuohy  Needle Placement: Paramedian epidural  Findings:   -Comments: Excellent flow of contrast into the epidural space.  Procedure Details: Using a paramedian approach from the side mentioned above, the region overlying the inferior lamina was localized under fluoroscopic visualization and the soft tissues overlying this structure were infiltrated with 4 ml. of 1% Lidocaine without Epinephrine. The Tuohy needle was inserted into the epidural space using a paramedian approach.   The epidural space was localized using loss of resistance along with counter oblique bi-planar fluoroscopic views.  After negative aspirate for air, blood, and CSF, a 2 ml. volume of Isovue-250 was injected into the epidural space and the flow of contrast was observed. Radiographs were obtained for documentation purposes.    The injectate was administered into the level noted above.   Additional Comments:  No complications occurred Dressing: 2 x 2 sterile gauze and Band-Aid    Post-procedure details: Patient was observed during the procedure. Post-procedure instructions were reviewed.  Patient left the clinic in stable condition.    Clinical History: MRI LUMBAR SPINE WITHOUT CONTRAST   TECHNIQUE: Multiplanar, multisequence MR imaging of the lumbar spine was performed. No intravenous contrast was administered.   COMPARISON:  Lumbar spine MRI 08/25/2011, a  08/02/2014 lumbar spine MRI is not available for comparison. Correlation is also made with lumbar spine radiographs 08/28/2020   FINDINGS: Segmentation:  5 lumbar type vertebral bodies.   Alignment:  Mild dextrocurvature of the  lumbar spine.   Vertebrae:  No fracture, evidence of discitis, or bone lesion.   Conus medullaris and cauda equina: Conus extends to the L1 level. Conus and cauda equina appear normal.   Paraspinal and other soft tissues: Negative.   Disc levels:   T12-L1: No significant disc bulge. No spinal canal stenosis or neural foraminal narrowing.   L1-L2: Disc desiccation and disc height loss, which is new compared to 2013. Mild disc bulge. Mild facet arthropathy. No spinal canal stenosis or neural foraminal narrowing.   L2-L3: Mild disc desiccation and minimal height loss. No significant disc bulge. Mild facet arthropathy. No spinal canal stenosis or neural foraminal narrowing.   L3-L4: Left foraminal and extreme lateral disc protrusion. Mild facet arthropathy. No spinal canal stenosis. Moderate left and mild right neural foraminal narrowing.   L4-L5: Unchanged disc desiccation and mild disc bulge with central annular fissure. Mild facet arthropathy. No spinal canal stenosis. No neural foraminal narrowing.   L5-S1: Unchanged disc desiccation with central disc bulge with annular fissure. Mild right-greater-than-left facet arthropathy. No spinal canal stenosis. Mild left-greater-than-right neural foraminal narrowing.   IMPRESSION: 1. Progression of previously noted degenerative disc disease compared to 2013, most prominently at L1-L2. 2. L3-L4 moderate left and mild right neural foraminal narrowing. 3. L5-S1 mild left-greater-than-right neural foraminal narrowing.     Electronically Signed   By: Merilyn Baba M.D.   On: 10/09/2020 16:03     Objective:  VS:  HT:    WT:   BMI:     BP:122/68  HR:73bpm  TEMP: ( )  RESP:  Physical Exam Vitals and nursing note reviewed.  Constitutional:      General: He is not in acute distress.    Appearance: Normal appearance. He is not ill-appearing.  HENT:     Head: Normocephalic and atraumatic.     Right Ear: External ear normal.      Left Ear: External ear normal.     Nose: No congestion.  Eyes:     Extraocular Movements: Extraocular movements intact.  Cardiovascular:     Rate and Rhythm: Normal rate.     Pulses: Normal pulses.  Pulmonary:     Effort: Pulmonary effort is normal. No respiratory distress.  Abdominal:     General: There is no distension.     Palpations: Abdomen is soft.  Musculoskeletal:        General: No tenderness or signs of injury.     Cervical back: Neck supple.     Right lower leg: No edema.     Left lower leg: No edema.     Comments: Patient has good distal strength without clonus.  Skin:    Findings: No erythema or rash.  Neurological:     General: No focal deficit present.     Mental Status: He is alert and oriented to person, place, and time.     Sensory: No sensory deficit.     Motor: No weakness or abnormal muscle tone.     Coordination: Coordination normal.  Psychiatric:        Mood and Affect: Mood normal.        Behavior: Behavior normal.     Imaging: No results found.

## 2020-11-16 NOTE — Procedures (Signed)
Lumbar Epidural Steroid Injection - Interlaminar Approach with Fluoroscopic Guidance  Patient: Oscar Henry      Date of Birth: 09/14/1985 MRN: [id number] PCP: Josetta Huddle, MD      Visit Date: 11/07/2020   Universal Protocol:     Consent Given By: the patient  Position: PRONE  Additional Comments: Vital signs were monitored before and after the procedure. Patient was prepped and draped in the usual sterile fashion. The correct patient, procedure, and site was verified.   Injection Procedure Details:   Procedure diagnoses: Lumbar radiculopathy [M54.16]   Meds Administered:  Meds ordered this encounter  Medications   betamethasone acetate-betamethasone sodium phosphate (CELESTONE) injection 12 mg     Laterality: Left  Location/Site:  L4-5  Needle: 3.5 in., 20 ga. Tuohy  Needle Placement: Paramedian epidural  Findings:   -Comments: Excellent flow of contrast into the epidural space.  Procedure Details: Using a paramedian approach from the side mentioned above, the region overlying the inferior lamina was localized under fluoroscopic visualization and the soft tissues overlying this structure were infiltrated with 4 ml. of 1% Lidocaine without Epinephrine. The Tuohy needle was inserted into the epidural space using a paramedian approach.   The epidural space was localized using loss of resistance along with counter oblique bi-planar fluoroscopic views.  After negative aspirate for air, blood, and CSF, a 2 ml. volume of Isovue-250 was injected into the epidural space and the flow of contrast was observed. Radiographs were obtained for documentation purposes.    The injectate was administered into the level noted above.   Additional Comments:  No complications occurred Dressing: 2 x 2 sterile gauze and Band-Aid    Post-procedure details: Patient was observed during the procedure. Post-procedure instructions were reviewed.  Patient left the clinic in stable  condition.

## 2021-03-25 ENCOUNTER — Ambulatory Visit: Payer: 59 | Admitting: Orthopaedic Surgery

## 2021-04-02 ENCOUNTER — Other Ambulatory Visit: Payer: Self-pay

## 2021-04-02 ENCOUNTER — Ambulatory Visit: Payer: 59 | Admitting: Orthopaedic Surgery

## 2021-04-02 ENCOUNTER — Encounter: Payer: Self-pay | Admitting: Orthopaedic Surgery

## 2021-04-02 DIAGNOSIS — M25521 Pain in right elbow: Secondary | ICD-10-CM

## 2021-04-02 NOTE — Progress Notes (Signed)
The patient is a very pleasant 36 year old gentleman that we have seen before.  It has been quite sometime.  He comes in with a chief complaint of right elbow pain.  He does work out a lot.  His pain is activity related.  He demonstrates that it really hurts around the elbow with pronation and supination of the elbow but not as much with flexion extension.  His pain seems to be lateral around the lateral epicondyle area but also the elbow joint itself.  In August 2021 we did obtain an MRI of his left elbow.  It actually showed partial thickness cartilage loss of the radiocapitellar joint and ulnohumeral joint.  The tenderness and ligamentous structures are all intact.  On exam today there may be just a slight elbow effusion.  He seems to hurt around the radial head and the lateral epicondyle area of the elbow.  His elbow range of motion is full but painful throughout supination and pronation.  It is stable ligamentously.  At this point I would like to send him to my partner Dr. Sammuel Hines to see if he has any recommendations for treatment for this patient's right elbow.  I will hold off on any type of injections today because in the past I did provide a steroid injection that he said really did not help much.  He agrees with this referral as well.

## 2021-04-08 ENCOUNTER — Ambulatory Visit (HOSPITAL_BASED_OUTPATIENT_CLINIC_OR_DEPARTMENT_OTHER): Payer: 59 | Admitting: Orthopaedic Surgery

## 2021-04-08 ENCOUNTER — Other Ambulatory Visit: Payer: Self-pay

## 2021-04-08 DIAGNOSIS — M25521 Pain in right elbow: Secondary | ICD-10-CM

## 2021-04-08 DIAGNOSIS — M25561 Pain in right knee: Secondary | ICD-10-CM

## 2021-04-08 NOTE — Progress Notes (Signed)
Chief Complaint: Right elbow pain     History of Present Illness:    Oscar Henry is a 36 y.o. male presents today for continued right elbow pain as a referral from Dr. Zollie Beckers.  He states that he has been having right elbow pain ongoing now for several years.  He is very active enjoys baseball golfing wrestling.  He states that he has previously had an intra-articular steroid injection but did not get significant relief from this.  He remains quite active in terms of working out and staying strong in the arm and this has not specifically helped him.  He states that when he goes into flexion or keeping the elbow In Extension for A While He Notices That It Will pop or feel a sharp pain in that position.  He endorses weakness on the side.  He was previously in [employer] but is no longer active duty.    Surgical History:   None  PMH/PSH/Family History/Social History/Meds/Allergies:    Past Medical History:  Diagnosis Date   Fibroma of right lower leg 2004   Past Surgical History:  Procedure Laterality Date   LAPAROSCOPIC APPENDECTOMY     Social History   Socioeconomic History   Marital status: Single    Spouse name: Not on file   Number of children: Not on file   Years of education: Not on file   Highest education level: Not on file  Occupational History   Not on file  Tobacco Use   Smoking status: Never   Smokeless tobacco: Never  Substance and Sexual Activity   Alcohol use: Yes   Drug use: No   Sexual activity: Not on file  Other Topics Concern   Not on file  Social History Narrative   Not on file   Social Determinants of Health   Financial Resource Strain: Not on file  Food Insecurity: Not on file  Transportation Needs: Not on file  Physical Activity: Not on file  Stress: Not on file  Social Connections: Not on file   No family history on file. No Known Allergies No current outpatient medications on file.   No  current facility-administered medications for this visit.   No results found.  Review of Systems:   A ROS was performed including pertinent positives and negatives as documented in the HPI.  Physical Exam :   Constitutional: NAD and appears stated age Neurological: Alert and oriented Psych: Appropriate affect and cooperative There were no vitals taken for this visit.   Comprehensive Musculoskeletal Exam:    Inspection Right Left  Skin No atrophy or gross abnormalities appreciated No atrophy or gross abnormalities appreciated  Palpation    Tenderness Tender over the radiocapitellar joint None  Crepitus None None  Range of Motion    Flexion (passive) 160 160  Flexion (active) 160 160  Extension 0 0  Pronation normal normal  Supination normal normal   Strength    Flexion  5/5 5/5  Extension 5/5 5/5  Pronation  5/5 5/5  Supination  5/5 5/5  Special Tests    Lateral epicondyle pain with resisted wrist extension Negative Negative  Medial epicondyle pain with resisted wrist flexion  Negative Negative  Tinel test over cubital tunnel  Negative  Negative   Instability    Generalized Laxity No No  Varus stress No instability No instability  Valgus stress  No instability No instability  Reflexes    Triceps Normal (2/4) Normal (2/4)  Brachioradialis Normal (2/4) Normal (2/4)  Biceps Normal (2/4) Normal (2/4)  Neurologic    Fires PIN, radial, median, ulnar, musculocutaneous, axillary, suprascapular, long thoracic, and spinal accessory innervated muscles. No abnormal sensibility.  Vascular/Lymphatic    Radial Pulse 2+ 2+  Cervical Exam    Patient has symmetric cervical range of motion with Negative Spurling's test.     Imaging:   Xray (right elbow from 2021): Essentially normal  MRI (right elbow 2021): There is near full-thickness capitellar cartilage loss with subchondral cysts.  Well-preserved radius  I personally reviewed and interpreted the radiographs.   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  Overall he continues to remain highly symptomatic from this.  At this time he has not had any relief from steroid injections so I have recommended that should he want additional injections I would consider something like PRP.  We did discuss that this is associated with an out-of-pocket cost.  He is strongly considering this and as such we will place a referral to [facility] [place] for PRP injection of the right elbow.  He is also considering elbow arthroscopy with debridement given the fact that he has not had any type of significant long-term relief.  I did discuss that outcomes are generally favorable with elbow debridement.  This is a relatively low risk procedure.  He would like to try the PRP first and if he does not get prolonged relief he would consider right elbow arthroscopy.  He is also having some right knee pain that is consistent with patellofemoral type pain.  As result I would like him to get a home program at physical therapy to work on his hip strength as well.  Plan :    -Plan for return to clinic following PRP to discuss results -Plan for referral to [facility] [place] for intra-articular right elbow PRP injection -Plan for physical therapy for right hip and knee program     I personally saw and evaluated the patient, and participated in the management and treatment plan.  Vanetta Mulders, MD Attending Physician, Orthopedic Surgery  This document was dictated using Dragon voice recognition software. A reasonable attempt at proof reading has been made to minimize errors.

## 2021-04-24 ENCOUNTER — Other Ambulatory Visit: Payer: Self-pay

## 2021-04-24 ENCOUNTER — Ambulatory Visit (INDEPENDENT_AMBULATORY_CARE_PROVIDER_SITE_OTHER): Payer: 59

## 2021-04-24 ENCOUNTER — Ambulatory Visit (INDEPENDENT_AMBULATORY_CARE_PROVIDER_SITE_OTHER): Payer: 59 | Admitting: Family Medicine

## 2021-04-24 ENCOUNTER — Encounter (HOSPITAL_BASED_OUTPATIENT_CLINIC_OR_DEPARTMENT_OTHER): Payer: Self-pay | Admitting: Family Medicine

## 2021-04-24 DIAGNOSIS — M25521 Pain in right elbow: Secondary | ICD-10-CM | POA: Diagnosis not present

## 2021-04-24 NOTE — Patient Instructions (Signed)
? ? ?  Follow-Up: ?Your next appointment:   ?Your physician recommends that you schedule a follow-up appointment in: 2 week  with Dr. [place] ? ?You will receive a text message or e-mail with a link to a survey about your care and experience with [place] today! We would greatly appreciate your feedback!  ? ?Thanks for letting us be apart of your health journey!!  ?Primary Care and Sports Medicine  ? ?Dr. Kyung Rudd [place]  ? ?We encourage you to activate your patient portal called "MyChart".  Sign up information is provided on this After Visit Summary.  MyChart is used to connect with patients for Virtual Visits (Telemedicine).  Patients are able to view lab/test results, encounter notes, upcoming appointments, etc.  Non-urgent messages can be sent to your provider as well. To learn more about what you can do with MyChart, please visit --  [email or web address].    ?

## 2021-04-24 NOTE — Assessment & Plan Note (Signed)
Patient is a 36 year old male presenting for consideration of PRP injection.  Patient has previously been evaluated by Dr. Sammuel Hines.  He had discussed options with patient and elected to proceed with PRP injection of the right elbow joint. ?Patient has had symptoms for about 10 years, initially developed following injury in the [employer].  Prior imaging is included x-ray as well as MRI, on MRI, there is evidence of cartilage loss, subchondral sclerosis, edema. ?Patient will typically have some stiffness, locking, pain with activities including lifting, pronation and supination. ?Discussed with patient general expectations related to PRP.  Discussed that initially, may have worsening of symptoms for 1 to 2 weeks prior to gradual improvement.  During this 1 to 2-week period, would keep activities generally limited, avoid any heavy lifting.  He may continue with aerobic exercise with indoor cycling.  For pain control, can use Tylenol, occasional icing, would avoid use of NSAIDs for at least the first week. ?Procedure note above ?Plan for follow-up in 2 weeks to monitor progress, patient will also be following up with Dr. Sammuel Hines.  Likely plan for initiating rehab following 2-week follow-up. ?

## 2021-04-24 NOTE — Progress Notes (Signed)
? ? ?  Procedures performed today:   ? ?Procedure: Real-time Ultrasound Guided injection of the right elbow joint ?Device: Samsung HS60  ?Verbal informed consent obtained.  ?Time-out conducted.  ?Noted no overlying erythema, induration, or other signs of local infection.  ?Skin prepped in a sterile fashion.  ?With sterile technique and under real time ultrasound guidance: 4 cc of platelet rich plasma (ReGen Kit BCT) ?Completed without difficulty.  Patient did have vasovagal reaction, did not have any syncope.  Symptoms resolved with use of cool compress, water and rest. ?Advised to call if fevers/chills, erythema, induration, drainage, or persistent bleeding.  ?Images permanently stored and available for review in PACS.  ?Impression: Technically successful ultrasound-guided injection. ? ?Independent interpretation of notes and tests performed by another provider:  ? ?None. ? ?Brief History, Exam, Impression, and Recommendations:   ? ?BP 122/88   Pulse 96   Ht 6' 2" (1.88 m)   Wt 230 lb (104.3 kg)   SpO2 96%   BMI 29.53 kg/m?  ? ?Elbow pain, right ?Patient is a 36-year-old male presenting for consideration of PRP injection.  Patient has previously been evaluated by Dr. Bokshan.  He had discussed options with patient and elected to proceed with PRP injection of the right elbow joint. ?Patient has had symptoms for about 10 years, initially developed following injury in the [employer].  Prior imaging is included x-ray as well as MRI, on MRI, there is evidence of cartilage loss, subchondral sclerosis, edema. ?Patient will typically have some stiffness, locking, pain with activities including lifting, pronation and supination. ?Discussed with patient general expectations related to PRP.  Discussed that initially, may have worsening of symptoms for 1 to 2 weeks prior to gradual improvement.  During this 1 to 2-week period, would keep activities generally limited, avoid any heavy lifting.  He may continue with aerobic  exercise with indoor cycling.  For pain control, can use Tylenol, occasional icing, would avoid use of NSAIDs for at least the first week. ?Procedure note above ?Plan for follow-up in 2 weeks to monitor progress, patient will also be following up with Dr. Bokshan.  Likely plan for initiating rehab following 2-week follow-up. ? ? ?___________________________________________ ?Raymond [place], MD, ABFM, CAQSM ?Primary Care and Sports Medicine ? [facility] ?

## 2021-04-30 ENCOUNTER — Other Ambulatory Visit: Payer: Self-pay

## 2021-04-30 ENCOUNTER — Ambulatory Visit (HOSPITAL_BASED_OUTPATIENT_CLINIC_OR_DEPARTMENT_OTHER): Payer: 59 | Attending: Orthopaedic Surgery | Admitting: Physical Therapy

## 2021-04-30 ENCOUNTER — Encounter (HOSPITAL_BASED_OUTPATIENT_CLINIC_OR_DEPARTMENT_OTHER): Payer: Self-pay | Admitting: Physical Therapy

## 2021-04-30 DIAGNOSIS — M25561 Pain in right knee: Secondary | ICD-10-CM | POA: Insufficient documentation

## 2021-04-30 DIAGNOSIS — M6281 Muscle weakness (generalized): Secondary | ICD-10-CM | POA: Diagnosis present

## 2021-04-30 NOTE — Therapy (Addendum)
?OUTPATIENT PHYSICAL THERAPY LOWER EXTREMITY EVALUATION ? ? ?Patient Name: Oscar Henry ?MRN: [id number] ?DOB:08/14/1985, 36 y.o., male ?Today's Date: 04/30/2021 ? ? PT End of Session - 04/30/21 1451   ? ? Visit Number 1   ? Number of Visits 18   ? Date for PT Re-Evaluation 07/29/21   ? Authorization Type UHC   ? PT Start Time 1345   ? PT Stop Time 1430   ? PT Time Calculation (min) 45 min   ? Activity Tolerance Patient tolerated treatment well   ? Behavior During Therapy [facility] for tasks assessed/performed   ? ?  ?  ? ?  ? ? ?Past Medical History:  ?Diagnosis Date  ? Fibroma of right lower leg 2004  ? ?Past Surgical History:  ?Procedure Laterality Date  ? LAPAROSCOPIC APPENDECTOMY    ? ?Patient Active Problem List  ? Diagnosis Date Noted  ? Elbow pain, right 04/24/2021  ? Pain in joint, lower leg 10/17/2020  ? Sinusitis 10/17/2020  ? ? ?PCP: Oscar Huddle, MD ? ?REFERRING PROVIDER: Vanetta Mulders, MD ? ?REFERRING DIAG: M25.561 (ICD-10-CM) - Right knee pain, unspecified chronicity ? ?THERAPY DIAG:  ?Pain in joint of right knee - Plan: PT plan of care cert/re-cert ? ?Muscle weakness (generalized) - Plan: PT plan of care cert/re-cert ? ?ONSET DATE: 10 yrs + ? ?SUBJECTIVE: Pt goes by Oscar Henry ? ?SUBJECTIVE STATEMENT: ?Pt states that the knee pain has been an ongoing issues. Pt is L leg dominant.  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 He also had a knee scope on the R with no relief in pain. Pt denies knee clicking and locking. Pt states he has back pain that causes him to compensate during the movement. Pt describes the knee pain as shooting down the knee. The pain is under the knee cap and goes down the outside of the leg. Pt states that getting up and out of a chair. Pt states squatting wider and currently squat low bar feels better on the knee. Works out 2x/week and does upper and lower body days with more emphasis on lower body day. Does not exceed 315lbs on squats, 4x10-15.  Endorses movie goers sign. Pt states that getting up and down stairs, getting up and out of truck. Cannot recreate pain with pressure/palpation. Pt has "pulled" his R quad last September.  ? ? Pt states pain does not linger. Only happen in that movement. Pt denies specific MOI or any swelling. Warming up used to relieve pain now it does not. Pt has tried quad rolling before with moderate success. Pt plays in a baseball league- difficult to push off R LE. ? ? 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 ?PAIN:  ?Are you having pain? No ?Anterior knee pain, sharp ? ?PRECAUTIONS: None ? ?WEIGHT BEARING RESTRICTIONS No ? ?FALLS:  ?Has patient fallen in last 6 months? No, Number of falls: 0 ? ?LIVING ENVIRONMENT: ?Lives with: lives with their spouse ?Lives in: House/apartment ?Stairs: Yes;  ?Has following equipment at home: None ? 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 ?PLOF: Independent ? ?PATIENT GOALS : reduce pain, improve QOL, get back to baseball ? ? ?OBJECTIVE:  ? ?DIAGNOSTIC FINDINGS: N/A for R knee ? ?PATIENT SURVEYS:  ?FOTO 94   ?8pts MCII ?76 pts  ? ?COGNITION: ? Overall cognitive status:  Within functional limits for tasks assessed   ?  ?SENSATION: ?WFL ? ?MUSCLE LENGTH: ?Positive supine H/S 90/90 ?Positive Ely's ? ?POSTURE:  ?WNL ? ?PALPATION: ?Significant hypertonicity of bilat rec fem, VL, and VMO; R TTP ? ?LE ROM: WNL at hip ? ?LE MMT:  HHD measured in lbs ? ? L knee ext 118+ lbs (unable to hold pt, use Tindeq at next testing), R knee ext 82.7lbs  ? ? HIP ABD L 41.5lbs  R 42.5lbs  ? ? ?LOWER EXTREMITY SPECIAL TESTS:  ?Hip special tests: Trendelenburg test: negative Knee special tests: Anterior drawer test: negative, Thessaly test: positive, Step up/down test: positive , and Varus/Valgus negative ? ?FUNCTIONAL TESTS:  ?Squatting:   WNL for technique, pain in R knee ?Lunge:  WNL for tech, pain in R knee ? ?SL pistol: dynamic valgus, quad dominant ? ?GAIT: ?Distance walked: 88f ?Assistive device utilized: None ?Level of assistance: Complete Independence ?Comments: WNL ? ? ?TODAY'S TREATMENT: ? ?Quad foam roll- tack and floss 2 mins, double leg ?Standing quad stretch 30s 3x ?Standing fire hydrant (hip ABD in SL quarter squat) black TB at ankles 10x each side ? ?PATIENT EDUCATION:  ?Education details: MOI, diagnosis, prognosis, DN edu, anatomy, exercise progression, DOMS expectations, muscle firing,  envelope of function, HEP, POC ? ?Person educated: Patient ?Education method: Explanation, Demonstration, Tactile cues, Verbal cues, and Handouts ?Education comprehension: verbalized understanding, returned demonstration, verbal cues required, and tactile cues required ? ? ?HOME EXERCISE PROGRAM: ?Access Code: [email or web address] ?URL: [email or web address] ?Date: 04/30/2021 ?Prepared by: ADaleen Bo? ?ASSESSMENT: ? ?CLINICAL IMPRESSION: ?Patient is a 36y.o. male who was seen today for physical therapy evaluation and treatment for cc of R knee pain. Pt's pain appears consistent with PFPS due to significant quad tissue length and overloading. Pt's sedentary work related duties likely also contribute to stiffness and pain. Pt's pain is moderately sensitive and irritable with loaded movements. Pt's largest deficit is quad soft tissue restriction and R LE motor control/strength quad and hip. Pt would benefit from continued skilled therapy in order to reach goals and maximize functional R LE strength and ROM for full return to PLOF.  ? ? ?OBJECTIVE IMPAIRMENTS Abnormal gait, decreased activity tolerance, decreased balance, decreased endurance, decreased mobility, decreased ROM, decreased strength, hypomobility, increased muscle spasms, impaired flexibility, improper body mechanics, postural dysfunction, and pain.  ? ?ACTIVITY LIMITATIONS cleaning, community activity, occupation, yard work,  shopping, and exercise and recreation .  ? ?PERSONAL FACTORS Fitness and Time since onset of injury/illness/exacerbation are also affecting patient's functional outcome.  ? ? ?REHAB POTENTIAL: Good ? ?CLINICAL DECISION MAKING: Stable/uncomplicated ? ?EVALUATION COMPLEXITY: Low ? ? ?GOALS: ? ? ?SHORT TERM GOALS: Target date: 06/11/2021 ? ?Pt will become independent with HEP in order to demonstrate synthesis of PT education.' ? ?Goal status: INITIAL ? ?2.  Pt will be able to demonstrate BW squat without pain in order to demonstrate functional improvement in LE function for self-care and house hold duties. ? ? ?Goal status: INITIAL ? ?3.  Pt will score at least 8 pt increase on FOTO to demonstrate functional improvement in MCII and pt perceived function.   ? ?Goal status: INITIAL ? ? ?LONG TERM GOALS: Target date: 07/23/2021 ? ?Pt  will become independent with final HEP in order to demonstrate synthesis of PT education. ? ? ?Goal status: INITIAL ? ?2.  Pt will score >/= 76 on FOTO to demonstrate improvement in perceived R LE function.  ? ?Goal status: INITIAL ? ?3.  Pt will be  able to demonstrate SL squat without pain in order to demonstrate functional improvement in LE function for full return to normal exercise. ? ? ?Goal status: INITIAL ? ?4.  Pt will be able to demonstrate ability to run/jog/cut without pain in order to demonstrate functional improvement and tolerance to plyometric loading.  ? ?Goal status: INITIAL ? ? ? ?PLAN: ?PT FREQUENCY: 1-2x/week ? ?PT DURATION: 12 weeks (likely D/C by 8 weeks) ? ?PLANNED INTERVENTIONS: Therapeutic exercises, Therapeutic activity, Neuromuscular re-education, Balance training, Gait training, Patient/Family education, Joint manipulation, Joint mobilization, Stair training, Orthotic/Fit training, Aquatic Therapy, Dry Needling, Electrical stimulation, Spinal manipulation, Spinal mobilization, Cryotherapy, Moist heat, scar mobilization, Taping, Vasopneumatic device, Traction,  Ultrasound, Ionotophoresis '4mg'$ /ml Dexamethasone, and Manual therapy ? ?PLAN FOR NEXT SESSION:  DN PRN, STM, reverse Nordic, [place] split squat ? ? ?Daleen Bo, PT ?04/30/2021, 2:53 PM  ?

## 2021-05-08 ENCOUNTER — Ambulatory Visit (INDEPENDENT_AMBULATORY_CARE_PROVIDER_SITE_OTHER): Payer: 59 | Admitting: Family Medicine

## 2021-05-08 ENCOUNTER — Encounter (HOSPITAL_BASED_OUTPATIENT_CLINIC_OR_DEPARTMENT_OTHER): Payer: Self-pay | Admitting: Family Medicine

## 2021-05-08 ENCOUNTER — Other Ambulatory Visit: Payer: Self-pay

## 2021-05-08 VITALS — BP 122/82 | HR 86 | Ht 74.0 in | Wt 226.0 lb

## 2021-05-08 DIAGNOSIS — M25521 Pain in right elbow: Secondary | ICD-10-CM

## 2021-05-08 NOTE — Assessment & Plan Note (Addendum)
Patient is a 36 year old male presenting for follow-up from recent PRP injection to right elbow.  Prior imaging including MRI has shown evidence of cartilage loss, subchondral sclerosis, edema within the elbow.  At last visit, PRP injection was completed into right elbow joint.  Since the procedure, patient feels that pain is mostly unchanged.  He has been working with physical therapy primarily related to a knee issue.  Has not recently done any right elbow rehab. ?On exam today, no significant tenderness to palpation about the elbow.  No pain with passive or active pronation-supination.  Does have some pain with flexion/extension.  Neurovascularly intact distally. ?At this point, feel that it is reasonable to begin with rehab/therapy.  Did provide home exercises for patient to proceed with, elastic band.  He does have follow-up with orthopedic surgeon in about 1 month.  Discussed that given the nature of PRP injection, our hope is that over the coming weeks and months that there will be gradual improvement in symptoms.  If patient does notice improvement in symptoms with PRP, it could be considered again in the future.  If however, patient does not perceive notable improvement in symptoms in the coming months, would likely consider alternative treatment options, possible surgical consideration ?

## 2021-05-08 NOTE — Progress Notes (Signed)
? ? ?  Procedures performed today:   ? ?None. ? ?Independent interpretation of notes and tests performed by another provider:  ? ?None. ? ?Brief History, Exam, Impression, and Recommendations:   ? ?BP 122/82   Pulse 86   Ht '6\' 2"'$  (1.88 m)   Wt 226 lb (102.5 kg)   SpO2 97%   BMI 29.02 kg/m?  ? ?Elbow pain, right ?Patient is a 36 year old male presenting for follow-up from recent PRP injection to right elbow.  Prior imaging including MRI has shown evidence of cartilage loss, subchondral sclerosis, edema within the elbow.  At last visit, PRP injection was completed into right elbow joint.  Since the procedure, patient feels that pain is mostly unchanged.  He has been working with physical therapy primarily related to a knee issue.  Has not recently done any right elbow rehab. ?On exam today, no significant tenderness to palpation about the elbow.  No pain with passive or active pronation-supination.  Does have some pain with flexion/extension.  Neurovascularly intact distally. ?At this point, feel that it is reasonable to begin with rehab/therapy.  Did provide home exercises for patient to proceed with, elastic band.  He does have follow-up with orthopedic surgeon in about 1 month.  Discussed that given the nature of PRP injection, our hope is that over the coming weeks and months that there will be gradual improvement in symptoms.  If patient does notice improvement in symptoms with PRP, it could be considered again in the future.  If however, patient does not perceive notable improvement in symptoms in the coming months, would likely consider alternative treatment options, possible surgical consideration ? ?Plan for follow-up with me as needed ? ? ?___________________________________________ ?Marixa Mellott [place], MD, ABFM, CAQSM ?Primary Care and Sports Medicine ?[place] ?

## 2021-05-08 NOTE — Patient Instructions (Signed)
? ? ? ?  Follow-Up: Your next appointment:   Your physician recommends that you schedule a follow-up appointment in: PRN with Dr. [place]  You will receive a text message or e-mail with a link to a survey about your care and experience with us today! We would greatly appreciate your feedback!   Thanks for letting us be apart of your health journey!!  Primary Care and Sports Medicine   Dr. Raymond [place]   We encourage you to activate your patient portal called "MyChart".  Sign up information is provided on this After Visit Summary.  MyChart is used to connect with patients for Virtual Visits (Telemedicine).  Patients are able to view lab/test results, encounter notes, upcoming appointments, etc.  Non-urgent messages can be sent to your provider as well. To learn more about what you can do with MyChart, please visit --  [email or web address].    

## 2021-05-13 ENCOUNTER — Ambulatory Visit (HOSPITAL_BASED_OUTPATIENT_CLINIC_OR_DEPARTMENT_OTHER): Payer: 59 | Admitting: Family Medicine

## 2021-05-21 ENCOUNTER — Ambulatory Visit (HOSPITAL_BASED_OUTPATIENT_CLINIC_OR_DEPARTMENT_OTHER): Payer: 59 | Attending: Orthopaedic Surgery | Admitting: Physical Therapy

## 2021-05-21 ENCOUNTER — Encounter (HOSPITAL_BASED_OUTPATIENT_CLINIC_OR_DEPARTMENT_OTHER): Payer: Self-pay | Admitting: Physical Therapy

## 2021-05-21 DIAGNOSIS — M6281 Muscle weakness (generalized): Secondary | ICD-10-CM | POA: Diagnosis present

## 2021-05-21 DIAGNOSIS — M25561 Pain in right knee: Secondary | ICD-10-CM | POA: Insufficient documentation

## 2021-05-21 NOTE — Therapy (Signed)
?OUTPATIENT PHYSICAL THERAPY LOWER EXTREMITY EVALUATION ? ? ?Patient Name: Oscar Henry ?MRN: [id number] ?DOB:31-Mar-1985, 36 y.o., male ?Today's Date: 05/21/2021 ? ? PT End of Session - 05/21/21 0949   ? ? Visit Number 2   ? Number of Visits 18   ? Date for PT Re-Evaluation 07/29/21   ? Authorization Type [employer]   ? PT Start Time 0800   ? PT Stop Time [phone]   ? PT Time Calculation (min) 43 min   ? Activity Tolerance Patient tolerated treatment well   ? Behavior During Therapy [facility] for tasks assessed/performed   ? ?  ?  ? ?  ? ? ? ?Past Medical History:  ?Diagnosis Date  ? Fibroma of right lower leg 2004  ? ?Past Surgical History:  ?Procedure Laterality Date  ? LAPAROSCOPIC APPENDECTOMY    ? ?Patient Active Problem List  ? Diagnosis Date Noted  ? Elbow pain, right 04/24/2021  ? Pain in joint, lower leg 10/17/2020  ? Sinusitis 10/17/2020  ? ? ?PCP: Josetta Huddle, MD ? ?REFERRING PROVIDER: Josetta Huddle, MD ? ?REFERRING DIAG: M25.561 (ICD-10-CM) - Right knee pain, unspecified chronicity ? ?THERAPY DIAG:  ?Pain in joint of right knee ? ?Muscle weakness (generalized) ? ?ONSET DATE: 10 yrs + ? ?SUBJECTIVE: Pt goes by [name] ? ?SUBJECTIVE STATEMENT: ?The patient reports he has felt like it is a little looser with the rolling. He has been working on his exercises and he is still lifting.  ? ? ? ?Pt states that the knee pain has been an ongoing issues. Pt is L leg dominant.  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 He also had a knee scope on the R with no relief in pain. Pt denies knee clicking and locking. Pt states he has back pain that causes him to compensate during the movement. Pt describes the knee pain as shooting down the knee. The pain is under the knee cap and goes down the outside of the leg. Pt states that getting up and out of a chair. Pt states squatting wider and currently squat low bar feels better on the knee. Works out 2x/week and does upper and lower body days with  more emphasis on lower body day. Does not exceed 315lbs on squats, 4x10-15. Endorses movie goers sign. Pt states that getting up and down stairs, getting up and out of truck. Cannot recreate pain with pressure/palpation. Pt has "pulled" his R quad last September.  ? ? Pt states pain does not linger. Only happen in that movement. Pt denies specific MOI or any swelling. Warming up used to relieve pain now it does not. Pt has tried quad rolling before with moderate success. Pt plays in a baseball league- difficult to push off R LE. ? ? 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 ?PAIN:  ?Are you having pain? No ?Anterior knee pain, sharp ? ?PRECAUTIONS: None ? ?WEIGHT BEARING RESTRICTIONS No ? ?FALLS:  ?Has patient fallen in last 6 months? No, Number of falls: 0 ? ?LIVING ENVIRONMENT: ?Lives with: lives with their spouse ?Lives in: House/apartment ?Stairs: Yes;  ?Has following equipment at home: None ? 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 ?PLOF: Independent ? ?PATIENT GOALS : reduce pain, improve QOL, get back to baseball ? ? ?OBJECTIVE:  ? ?DIAGNOSTIC FINDINGS: N/A for R knee ? ?  PATIENT SURVEYS:  ?FOTO 71   ?8pts MCII ?76 pts  ? ?COGNITION: ? Overall cognitive status: Within functional limits for tasks assessed   ?  ?SENSATION: ?WFL ? ?MUSCLE LENGTH: ?Positive supine H/S 90/90 ?Positive Ely's ? ?POSTURE:  ?WNL ? ?PALPATION: ?Significant hypertonicity of bilat rec fem, VL, and VMO; R TTP ? ?LE ROM: WNL at hip ? ?LE MMT:  HHD measured in lbs ? ? L knee ext 118+ lbs (unable to hold pt, use Tindeq at next testing), R knee ext 82.7lbs  ? ? HIP ABD L 41.5lbs  R 42.5lbs  ? ? ?LOWER EXTREMITY SPECIAL TESTS:  ?Hip special tests: Trendelenburg test: negative Knee special tests: Anterior drawer test: negative, Thessaly test: positive, Step up/down test: positive , and Varus/Valgus  negative ? ?FUNCTIONAL TESTS:  ?Squatting:   WNL for technique, pain in R knee ?Lunge: WNL for tech, pain in R knee ? ?SL pistol: dynamic valgus, quad dominant ? ?GAIT: ?Distance walked: 22f ?Assistive device utilized: None ?Level of assistance: Complete Independence ?Comments: WNL ? ? ?TODAY'S TREATMENT: ?4/4 ?Trigger Point Dry-Needling  ?Treatment instructions: Expect mild to moderate muscle soreness. S/S of pneumothorax if dry needled over a lung field, and to seek immediate medical attention should they occur. Patient verbalized understanding of these instructions and education. ? ?Patient Consent Given: Yes ?Education handout provided: Yes ?Muscles treated: lateral quad 4 spots starting distal and moving proximal the 2 more proximal spots got the best twitch  ?Electrical stimulation performed:  ?Parameters: N/A ?Treatment response/outcome:twitch elicited last 2 had a great twitch   ? ?Trigger point release to lateral quad  ? ?Cone drill 2x10  on each side. Could feels a difference left V right  ?Split squat with review of technique. No pain but he had a good quad burn  ? ?SLR x20 without touching cuing to stop if he felt it more laterally ?Bridge with band cuing for keeping knees straight 2x10   ? ? ? ? ? ? ? ?Eval:  ? ?Quad foam roll- tack and floss 2 mins, double leg ?Standing quad stretch 30s 3x ?Standing fire hydrant (hip ABD in SL quarter squat) black TB at ankles 10x each side ? ?PATIENT EDUCATION:  ?Education details: MOI, diagnosis, prognosis, DN edu, anatomy, exercise progression, DOMS expectations, muscle firing,  envelope of function, HEP, POC ? ?Person educated: Patient ?Education method: Explanation, Demonstration, Tactile cues, Verbal cues, and Handouts ?Education comprehension: verbalized understanding, returned demonstration, verbal cues required, and tactile cues required ? ? ?HOME EXERCISE PROGRAM: ?Access Code: [email or web address] ?URL: [email or web address] ?Date: 04/30/2021 ?Prepared  by: ADaleen Bo? ?ASSESSMENT: ? ?CLINICAL IMPRESSION: ?The patient tolerated treatment well. He had no significant pain with exercises. Therapy encouraged him to shorten his movements and work on sMetallurgist He was given an updated HEP including exercise he can do while traveling in the truck. He was encouraged to continue with his current stretching program. He had a great twitch response in his lateral quad with needling. We will re-assess his program next visit.  ? ?OBJECTIVE IMPAIRMENTS Abnormal gait, decreased activity tolerance, decreased balance, decreased endurance, decreased mobility, decreased ROM, decreased strength, hypomobility, increased muscle spasms, impaired flexibility, improper body mechanics, postural dysfunction, and pain.  ? ?ACTIVITY LIMITATIONS cleaning, community activity, occupation, yard work, shopping, and exercise and recreation .  ? ?PERSONAL FACTORS Fitness and Time since onset of injury/illness/exacerbation are also affecting patient's functional outcome.  ? ? ?REHAB POTENTIAL: Good ? ?CLINICAL DECISION MAKING: Stable/uncomplicated ? ?EVALUATION COMPLEXITY: Low ? ? ?  GOALS: ? ? ?SHORT TERM GOALS: Target date: 07/02/2021 ? ?Pt will become independent with HEP in order to demonstrate synthesis of PT education.' ? ?Goal status: INITIAL ? ?2.  Pt will be able to demonstrate BW squat without pain in order to demonstrate functional improvement in LE function for self-care and house hold duties. ? ? ?Goal status: INITIAL ? ?3.  Pt will score at least 8 pt increase on FOTO to demonstrate functional improvement in MCII and pt perceived function.   ? ?Goal status: INITIAL ? ? ?LONG TERM GOALS: Target date: 08/13/2021 ? ?Pt  will become independent with final HEP in order to demonstrate synthesis of PT education. ? ? ?Goal status: INITIAL ? ?2.  Pt will score >/= 76 on FOTO to demonstrate improvement in perceived R LE function.  ? ?Goal status: INITIAL ? ?3.  Pt will be able to  demonstrate SL squat without pain in order to demonstrate functional improvement in LE function for full return to normal exercise. ? ? ?Goal status: INITIAL ? ?4.  Pt will be able to demonstrate ability to run/jog/cut

## 2021-05-28 ENCOUNTER — Encounter (HOSPITAL_BASED_OUTPATIENT_CLINIC_OR_DEPARTMENT_OTHER): Payer: 59 | Admitting: Physical Therapy

## 2021-06-03 ENCOUNTER — Ambulatory Visit (HOSPITAL_BASED_OUTPATIENT_CLINIC_OR_DEPARTMENT_OTHER): Payer: 59 | Admitting: Orthopaedic Surgery

## 2021-06-03 ENCOUNTER — Other Ambulatory Visit (HOSPITAL_BASED_OUTPATIENT_CLINIC_OR_DEPARTMENT_OTHER): Payer: Self-pay | Admitting: Orthopaedic Surgery

## 2021-06-03 DIAGNOSIS — M25521 Pain in right elbow: Secondary | ICD-10-CM

## 2021-06-04 ENCOUNTER — Encounter (HOSPITAL_BASED_OUTPATIENT_CLINIC_OR_DEPARTMENT_OTHER): Payer: Self-pay | Admitting: Physical Therapy

## 2021-06-04 ENCOUNTER — Ambulatory Visit (HOSPITAL_BASED_OUTPATIENT_CLINIC_OR_DEPARTMENT_OTHER): Payer: 59 | Admitting: Physical Therapy

## 2021-06-04 DIAGNOSIS — M25561 Pain in right knee: Secondary | ICD-10-CM | POA: Diagnosis not present

## 2021-06-04 DIAGNOSIS — M6281 Muscle weakness (generalized): Secondary | ICD-10-CM

## 2021-06-04 NOTE — Therapy (Signed)
?OUTPATIENT PHYSICAL THERAPY LOWER EXTREMITY EVALUATION ? ? ?Patient Name: Oscar Henry ?MRN: [id number] ?DOB:[birth year], 36 y.o., male ?Today's Date: 06/04/2021 ? ? ? ? ? ?Past Medical History:  ?Diagnosis Date  ? Fibroma of right lower leg 2004  ? ?Past Surgical History:  ?Procedure Laterality Date  ? LAPAROSCOPIC APPENDECTOMY    ? ?Patient Active Problem List  ? Diagnosis Date Noted  ? Elbow pain, right 04/24/2021  ? Pain in joint, lower leg 10/17/2020  ? Sinusitis 10/17/2020  ? ? ?PCP: Josetta Huddle, MD ? ?REFERRING PROVIDER: Josetta Huddle, MD ? ?REFERRING DIAG: M25.561 (ICD-10-CM) - Right knee pain, unspecified chronicity ? ?THERAPY DIAG:  ?No diagnosis found. ? ?ONSET DATE: 10 yrs + ? ?SUBJECTIVE: Pt goes by [name] ? ?SUBJECTIVE STATEMENT: ?The patient reports he has felt like it is a little looser with the rolling. He has been working on his exercises and he is still lifting.  ? ? ? ?Pt states that the knee pain has been an ongoing issues. Pt is L leg dominant.  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 He also had a knee scope on the R with no relief in pain. Pt denies knee clicking and locking. Pt states he has back pain that causes him to compensate during the movement. Pt describes the knee pain as shooting down the knee. The pain is under the knee cap and goes down the outside of the leg. Pt states that getting up and out of a chair. Pt states squatting wider and currently squat low bar feels better on the knee. Works out 2x/week and does upper and lower body days with more emphasis on lower body day. Does not exceed 315lbs on squats, 4x10-15. Endorses movie goers sign. Pt states that getting up and down stairs, getting up and out of truck. Cannot recreate pain with pressure/palpation. Pt has "pulled" his R quad last September.  ? ? Pt states pain does not linger. Only happen in that movement. Pt denies specific MOI or any swelling. Warming up used to relieve pain  now it does not. Pt has tried quad rolling before with moderate success. Pt plays in a baseball league- difficult to push off R LE. ? ? 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 ?PAIN:  ?Are you having pain? No ?Anterior knee pain, sharp ? ?PRECAUTIONS: None ? ?WEIGHT BEARING RESTRICTIONS No ? ?FALLS:  ?Has patient fallen in last 6 months? No, Number of falls: 0 ? ?LIVING ENVIRONMENT: ?Lives with: lives with their spouse ?Lives in: House/apartment ?Stairs: Yes;  ?Has following equipment at home: None ? 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 ?PLOF: Independent ? ?PATIENT GOALS : reduce pain, improve QOL, get back to baseball ? ? ?OBJECTIVE:  ? ?DIAGNOSTIC FINDINGS: N/A for R knee ? ?PATIENT SURVEYS:  ?FOTO 78   ?8pts MCII ?76 pts  ? ?COGNITION: ? Overall cognitive status: Within functional limits for tasks assessed   ?  ?SENSATION: ?WFL ? ?MUSCLE LENGTH: ?Positive supine H/S 90/90 ?Positive Ely's ? ?POSTURE:  ?WNL ? ?PALPATION: ?Significant hypertonicity of bilat rec fem, VL, and VMO; R TTP ? ?LE ROM: WNL at hip ? ?LE MMT:  HHD measured in lbs ? ? L knee ext 118+ lbs (unable to hold pt, use Tindeq at next testing), R knee ext 82.7lbs  ? ? HIP  ABD L 41.5lbs  R 42.5lbs  ? ? ?LOWER EXTREMITY SPECIAL TESTS:  ?Hip special tests: Trendelenburg test: negative Knee special tests: Anterior drawer test: negative, Thessaly test: positive, Step up/down test: positive , and Varus/Valgus negative ? ?FUNCTIONAL TESTS:  ?Squatting:   WNL for technique, pain in R knee ?Lunge: WNL for tech, pain in R knee ? ?SL pistol: dynamic valgus, quad dominant ? ?GAIT: ?Distance walked: 76f ?Assistive device utilized: None ?Level of assistance: Complete Independence ?Comments: WNL ? ? ?TODAY'S TREATMENT: ?4/18 ?Treatment instructions: Expect mild to moderate muscle soreness. S/S of pneumothorax if dry  needled over a lung field, and to seek immediate medical attention should they occur. Patient verbalized understanding of these instructions and education. ? ?Patient Consent Given: Yes ?Education handout provided: Yes ?Muscles treated: lateral quad 4 spots starting distal and moving proximal the 2 more proximal spots got the best twitch  ?Electrical stimulation performed:  ?Parameters: N/A ?Treatment response/outcome:twitch elicited last 2 had a great twitch   ? ?Single leg bridge 2x10  ?Lateral band walk blue 2x10  ?Squat with band 2x10 Blue  ?Monster walk 2x10 blue  ?Retro band walk blue 2x10  ?3 way short lunge x5 each leg  ? ?Reviewed how to use his HEP at home.  ? ? ?4/4 ?Trigger Point Dry-Needling  ?Treatment instructions: Expect mild to moderate muscle soreness. S/S of pneumothorax if dry needled over a lung field, and to seek immediate medical attention should they occur. Patient verbalized understanding of these instructions and education. ? ?Patient Consent Given: Yes ?Education handout provided: Yes ?Muscles treated: lateral quad 4 spots starting distal and moving proximal the 2 more proximal spots got the best twitch  ?Electrical stimulation performed:  ?Parameters: N/A ?Treatment response/outcome:twitch elicited last 2 had a great twitch   ? ?Trigger point release to lateral quad  ? ?Cone drill 2x10  on each side. Could feels a difference left V right  ?Split squat with review of technique. No pain but he had a good quad burn  ? ?SLR x20 without touching cuing to stop if he felt it more laterally ?Bridge with band cuing for keeping knees straight 2x10   ? ? ? ? ? ? ? ?Eval:  ? ?Quad foam roll- tack and floss 2 mins, double leg ?Standing quad stretch 30s 3x ?Standing fire hydrant (hip ABD in SL quarter squat) black TB at ankles 10x each side ? ?PATIENT EDUCATION:  ?Education details: MOI, diagnosis, prognosis, DN edu, anatomy, exercise progression, DOMS expectations, muscle firing,  envelope of  function, HEP, POC ? ?Person educated: Patient ?Education method: Explanation, Demonstration, Tactile cues, Verbal cues, and Handouts ?Education comprehension: verbalized understanding, returned demonstration, verbal cues required, and tactile cues required ? ? ?HOME EXERCISE PROGRAM: ?Access Code: [email or web address] ?URL: [email or web address] ?Date: 04/30/2021 ?Prepared by: ADaleen Bo? ?ASSESSMENT: ? ?CLINICAL IMPRESSION: ?The patient is making good progress. Therapy performed needling again but he did not have as much of a twitch. He was given a mid range band series that he can do when he strops the truck. We will continue to progress as tolerated. If he does well next visit he may D/C.  ? ?OBJECTIVE IMPAIRMENTS Abnormal gait, decreased activity tolerance, decreased balance, decreased endurance, decreased mobility, decreased ROM, decreased strength, hypomobility, increased muscle spasms, impaired flexibility, improper body mechanics, postural dysfunction, and pain.  ? ?ACTIVITY LIMITATIONS cleaning, community activity, occupation, yard work, shopping, and exercise and recreation .  ? ?PERSONAL FACTORS Fitness and Time since onset of injury/illness/exacerbation are also  affecting patient's functional outcome.  ? ? ?REHAB POTENTIAL: Good ? ?CLINICAL DECISION MAKING: Stable/uncomplicated ? ?EVALUATION COMPLEXITY: Low ? ? ?GOALS: ? ? ?SHORT TERM GOALS: Target date: 07/16/2021 ? ?Pt will become independent with HEP in order to demonstrate synthesis of PT education.' ? ?Goal status: INITIAL ? ?2.  Pt will be able to demonstrate BW squat without pain in order to demonstrate functional improvement in LE function for self-care and house hold duties. ? ? ?Goal status: INITIAL ? ?3.  Pt will score at least 8 pt increase on FOTO to demonstrate functional improvement in MCII and pt perceived function.   ? ?Goal status: INITIAL ? ? ?LONG TERM GOALS: Target date: 08/27/2021 ? ?Pt  will become independent with final HEP in  order to demonstrate synthesis of PT education. ? ? ?Goal status: INITIAL ? ?2.  Pt will score >/= 76 on FOTO to demonstrate improvement in perceived R LE function.  ? ?Goal status: INITIAL ? ?3.  Pt will be able to

## 2021-06-11 ENCOUNTER — Encounter (HOSPITAL_BASED_OUTPATIENT_CLINIC_OR_DEPARTMENT_OTHER): Payer: Self-pay | Admitting: Physical Therapy

## 2021-06-11 ENCOUNTER — Ambulatory Visit (HOSPITAL_BASED_OUTPATIENT_CLINIC_OR_DEPARTMENT_OTHER): Payer: 59 | Admitting: Physical Therapy

## 2021-06-11 DIAGNOSIS — M6281 Muscle weakness (generalized): Secondary | ICD-10-CM

## 2021-06-11 DIAGNOSIS — M25561 Pain in right knee: Secondary | ICD-10-CM

## 2021-06-11 NOTE — Therapy (Addendum)
?OUTPATIENT PHYSICAL THERAPY LOWER EXTREMITY TREATMENT ? ?Patient Name: Oscar Henry ?MRN: [id number] ?DOB:1985/10/28, 36 y.o., male ?Today's Date: 06/11/2021 ? ? PT End of Session - 06/11/21 0802   ? ? Visit Number 4   ? Number of Visits 18   ? Date for PT Re-Evaluation 07/29/21   ? Authorization Type UHC   ? PT Start Time 0802   ? PT Stop Time 0840   ? PT Time Calculation (min) 38 min   ? Activity Tolerance Patient tolerated treatment well   ? Behavior During Therapy [facility] for tasks assessed/performed   ? ?  ?  ? ?  ? ? ? ? ?Past Medical History:  ?Diagnosis Date  ? Fibroma of right lower leg 2004  ? ?Past Surgical History:  ?Procedure Laterality Date  ? LAPAROSCOPIC APPENDECTOMY    ? ?Patient Active Problem List  ? Diagnosis Date Noted  ? Elbow pain, right 04/24/2021  ? Pain in joint, lower leg 10/17/2020  ? Sinusitis 10/17/2020  ? ? ?PCP: Oscar Huddle, MD ? ?REFERRING PROVIDER: Josetta Huddle, MD ? ?REFERRING DIAG: M25.561 (ICD-10-CM) - Right knee pain, unspecified chronicity ? ?THERAPY DIAG:  ?Pain in joint of right knee ? ?Muscle weakness (generalized) ? ?ONSET DATE: 10 yrs + ? ?SUBJECTIVE: Pt goes by Oscar Henry ? ?SUBJECTIVE STATEMENT: ?Pt states it is feeling better. He is feeling about 45-55% better. His lateral movements with pushing off are no longer painful. He is been doing some single leg plyometrics.  SL lunging still causes.  ? ? ?Eval:  ?Pt states that the knee pain has been an ongoing issues. Pt is L leg dominant.  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 He also had a knee scope on the R with no relief in pain. Pt denies knee clicking and locking. Pt states he has back pain that causes him to compensate during the movement. Pt describes the knee pain as shooting down the knee. The pain is under the knee cap and goes down the outside of the leg. Pt states that getting up and out of a chair. Pt states squatting wider and currently squat low bar feels better on the  knee. Works out 2x/week and does upper and lower body days with more emphasis on lower body day. Does not exceed 315lbs on squats, 4x10-15. Endorses movie goers sign. Pt states that getting up and down stairs, getting up and out of truck. Cannot recreate pain with pressure/palpation. Pt has "pulled" his R quad last September.  ? ? Pt states pain does not linger. Only happen in that movement. Pt denies specific MOI or any swelling. Warming up used to relieve pain now it does not. Pt has tried quad rolling before with moderate success. Pt plays in a baseball league- difficult to push off R LE. ? ? 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 ?PAIN:  ?Are you having pain? No ?Anterior knee pain, sharp ? ?LIVING ENVIRONMENT: ?Lives with: lives with their spouse ?Lives in: House/apartment ?Stairs: Yes;  ?Has following equipment at home: None ? 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 ?PLOF: Independent ? ?PATIENT GOALS : reduce pain, improve QOL, get back to baseball ? ? ?OBJECTIVE:  ? ?DIAGNOSTIC FINDINGS: N/A for R knee ? ?PATIENT SURVEYS:  ?FOTO 10   ?8pts MCII ?76 pts  ? ?FUNCTIONAL  TESTS:  ?Squatting:   WNL for technique, pain in R knee ?Lunge: WNL for tech, pain in R knee ?Feet  together squat: WFL for mobility at ankle and knee  ?Split squat: frontal plane knee deviation on R, decreased strength into knee extension ? ?GAIT: ?Distance walked: 65f ?Assistive device utilized: None ?Level of assistance: Complete Independence ?Comments: WNL ? ? ?TODAY'S TREATMENT: ? ?4/25 ? ?Kneeling ant hip mob with band 3-5s 10x ?Reverse Nordic 3s 10x, isometric with band assist 5s 10x ?BSS no weight 5s 10x  2x10 on R, 10x on L  ?Knee ext up2 down1 2x10 ? ? ?4/18 ?Treatment instructions: Expect mild to moderate muscle soreness. S/S of pneumothorax if dry needled over a lung field, and to seek  immediate medical attention should they occur. Patient verbalized understanding of these instructions and education. ? ?Patient Consent Given: Yes ?Education handout provided: Yes ?Muscles treated: lateral quad 4 spots starting distal and moving proximal the 2 more proximal spots got the best twitch  ?Electrical stimulation performed:  ?Parameters: N/A ?Treatment response/outcome:twitch elicited last 2 had a great twitch   ? ?Single leg bridge 2x10  ?Lateral band walk blue 2x10  ?Squat with band 2x10 Blue  ?Monster walk 2x10 blue  ?Retro band walk blue 2x10  ?3 way short lunge x5 each leg  ? ?Reviewed how to use his HEP at home.  ? ? ?4/4 ?Trigger Point Dry-Needling  ?Treatment instructions: Expect mild to moderate muscle soreness. S/S of pneumothorax if dry needled over a lung field, and to seek immediate medical attention should they occur. Patient verbalized understanding of these instructions and education. ? ?Patient Consent Given: Yes ?Education handout provided: Yes ?Muscles treated: lateral quad 4 spots starting distal and moving proximal the 2 more proximal spots got the best twitch  ?Electrical stimulation performed:  ?Parameters: N/A ?Treatment response/outcome:twitch elicited last 2 had a great twitch   ? ?Trigger point release to lateral quad  ? ?Cone drill 2x10  on each side. Could feels a difference left V right  ?Split squat with review of technique. No pain but he had a good quad burn  ? ?SLR x20 without touching cuing to stop if he felt it more laterally ?Bridge with band cuing for keeping knees straight 2x10   ? ? ? ? ? ? ? ?Eval:  ? ?Quad foam roll- tack and floss 2 mins, double leg ?Standing quad stretch 30s 3x ?Standing fire hydrant (hip ABD in SL quarter squat) black TB at ankles 10x each side ? ?PATIENT EDUCATION:  ?Education details: MOI, diagnosis, prognosis, DN edu, anatomy, exercise progression, DOMS expectations, muscle firing,  envelope of function, HEP, POC ? ?Person educated:  Patient ?Education method: Explanation, Demonstration, Tactile cues, Verbal cues, and Handouts ?Education comprehension: verbalized understanding, returned demonstration, verbal cues required, and tactile cues required ? ? ?HOME EXERCISE PROGRAM: ?Access Code: [email or web address] ?URL: [email or web address] ?Date: 04/30/2021 ?Prepared by: ADaleen Bo? ?ASSESSMENT: ? ?CLINICAL IMPRESSION: ?Pt demos R quad weakness with SL activity that also causes frontal plane knee deviation. Pt able to incorporate targeted R quad and hip strength at today's session without exacerbating pain. Dynamic lunging at this time painful that is likely due to anterior knee translation and increaesed degrees of freedom. Pt did well with static SL activity but with report feeling of noticeable weakness from R to L. Pt HEP modified verbally to include isometrics, eccentric knee extensions reverse Nordics, and Bulg split squats at this time. Plan to continue with R LE strength as  able but at a reduced frequency as pt is very good to HEP and exercise at the gym Pt would benefit from continued skilled therapy in order to reach goals and maximize functional R LE strength and ROM for full return to PLOF. ? ? ?OBJECTIVE IMPAIRMENTS Abnormal gait, decreased activity tolerance, decreased balance, decreased endurance, decreased mobility, decreased ROM, decreased strength, hypomobility, increased muscle spasms, impaired flexibility, improper body mechanics, postural dysfunction, and pain.  ? ?ACTIVITY LIMITATIONS cleaning, community activity, occupation, yard work, shopping, and exercise and recreation .  ? ?PERSONAL FACTORS Fitness and Time since onset of injury/illness/exacerbation are also affecting patient's functional outcome.  ? ? ?REHAB POTENTIAL: Good ? ?CLINICAL DECISION MAKING: Stable/uncomplicated ? ?EVALUATION COMPLEXITY: Low ? ? ?GOALS: ? ? ?SHORT TERM GOALS: Target date: 07/23/2021 ? ?Pt will become independent with HEP in order to  demonstrate synthesis of PT education.' ? ?Goal status: INITIAL ? ?2.  Pt will be able to demonstrate BW squat without pain in order to demonstrate functional improvement in LE function for self-care and house hold duties.

## 2021-06-24 ENCOUNTER — Ambulatory Visit (HOSPITAL_BASED_OUTPATIENT_CLINIC_OR_DEPARTMENT_OTHER): Payer: 59 | Admitting: Orthopaedic Surgery

## 2021-06-24 ENCOUNTER — Ambulatory Visit (HOSPITAL_BASED_OUTPATIENT_CLINIC_OR_DEPARTMENT_OTHER): Payer: Self-pay | Admitting: Orthopaedic Surgery

## 2021-06-24 DIAGNOSIS — M9320 Osteochondritis dissecans of unspecified site: Secondary | ICD-10-CM

## 2021-06-24 NOTE — Progress Notes (Signed)
? ?                            ? ? ?Chief Complaint: Right elbow pain ?  ? ? ?History of Present Illness:  ? ?06/24/2021: Presents today for follow-up of his right elbow status post injection with platelet rich plasma.  Overall he got very little to no relief from this.  He continues to experience pain in the posterior lateral aspect of the elbow.  He will occasionally continue to feel a sharp pop and click when going into extension.  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  He has continued to go for physical therapy for the right elbow as well as right hip and knee.  His leg overall feels dramatically better although he is not experiencing any improvements with the elbow. ? ?Oscar Henry is a 36 y.o. male RHD presents today for continued right elbow pain as a referral from Dr. Zollie Beckers.  He states that he has been having right elbow pain ongoing now for several years.  He is very active enjoys baseball golfing wrestling.  He states that he has previously had an intra-articular steroid injection but did not get significant relief from this.  He remains quite active in terms of working out and staying strong in the arm and this has not specifically helped him.  He states that when he goes into flexion or keeping the elbow In Extension for A While He Notices That It Will pop or feel a sharp pain in that position.  He endorses weakness on the side.  He was previously in [employer] but is no longer active duty.  He works in a Estate agent. ? ? ? ?Surgical History:   ?None ? ?PMH/PSH/Family History/Social History/Meds/Allergies:   ? ?Past Medical History:  ?Diagnosis Date  ? Fibroma of right lower leg 2004  ? ?Past Surgical History:  ?Procedure Laterality Date  ? LAPAROSCOPIC APPENDECTOMY    ? ?Social History  ? ?Socioeconomic History  ? Marital status: Single  ?  Spouse name: Not on file  ? Number of children: Not on file  ? Years of  education: Not on file  ? Highest education level: Not on file  ?Occupational History  ? Not on file  ?Tobacco Use  ? Smoking status: Never  ? Smokeless tobacco: Never  ?Substance and Sexual Activity  ? Alcohol use: Yes  ? Drug use: No  ? Sexual activity: Not on file  ?Other Topics Concern  ? Not on file  ?Social History Narrative  ? Not on file  ? ?Social Determinants of Health  ? ?Financial Resource Strain: Not on file  ?Food Insecurity: Not on file  ?Transportation Needs: Not on file  ?Physical Activity: Not on file  ?Stress: Not on file  ?Social Connections: Not on file  ? ?No family history on file. ?No Known Allergies ?No current outpatient medications on file.  ? ?No current facility-administered medications for this visit.  ? ?No results found. ? ?Review of Systems:   ?A ROS was performed including pertinent positives and negatives as documented in the HPI. ? ?Physical Exam :   ?Constitutional: NAD and appears stated age ?Neurological: Alert and oriented ?Psych: Appropriate affect and cooperative ?There were no vitals taken for this visit.  ? ?Comprehensive Musculoskeletal Exam:   ? ?Inspection Right Left  ?Skin No atrophy or gross  abnormalities appreciated No atrophy or gross abnormalities appreciated  ?Palpation    ?Tenderness Tender over the radiocapitellar joint particularly posteriorly None  ?Crepitus None None  ?Range of Motion    ?Flexion (passive) 160 160  ?Flexion (active) 160 160  ?Extension 0 with pain and click 0  ?Pronation normal normal  ?Supination normal normal   ?Strength    ?Flexion  5/5 5/5  ?Extension 5/5 5/5  ?Pronation  5/5 5/5  ?Supination  5/5 5/5  ?Special Tests    ?Lateral epicondyle pain with resisted wrist extension Negative Negative  ?Medial epicondyle pain with resisted wrist flexion  Negative Negative  ?Tinel test over cubital tunnel  Negative  Negative   ?Instability    ?Generalized Laxity No No  ?Varus stress No instability No instability  ?Valgus stress  No instability No  instability  ?Reflexes    ?Triceps Normal (2/4) Normal (2/4)  ?Brachioradialis Normal (2/4) Normal (2/4)  ?Biceps Normal (2/4) Normal (2/4)  ?Neurologic    ?Fires PIN, radial, median, ulnar, musculocutaneous, axillary, suprascapular, long thoracic, and spinal accessory innervated muscles. No abnormal sensibility.  ?Vascular/Lymphatic    ?Radial Pulse 2+ 2+  ?Cervical Exam    ?Patient has symmetric cervical range of motion with Negative Spurling's test.  ?  ? ?Imaging:   ?Xray (right elbow from 2021): ?Mild degenerative findings involving the radiocapitellar joint with question of loose bodies. ? ?MRI (right elbow 2021): ?There is near full-thickness capitellar cartilage loss with subchondral cysts.  Well-preserved radius.  There does appear to be a possible loose body in the olecranon fossa. ? ?I personally reviewed and interpreted the radiographs. ? ? 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  Overall he continues to remain highly symptomatic from this.  His exam is very consistent with a capitellar cartilage loss with a palpable click into extension which is painful.  He is experiencing pain in the posterior lateral aspect of the elbow.  He has now trialed multiple injections including with steroid and PRP.  He has participated in physical therapy in the past for the right elbow and strengthening with little improvement.  He is now limited in terms of playing golf and staying active which she enjoys doing.  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  Given the fact that he has trialed multiple forms of conservative management with minimal relief, I do believe that there would be a role for elbow arthroscopy with debridement and abrasion arthroplasty of the capitellum.  I did discuss that this would require a minimal period of time off of work.  He would ultimately like to consider this if he continues to remain quite  limited.  We did discuss continued nonoperative management as well although he is quite frustrated as he has not gotten any relief from injections or strengthening at this time.  After continued discussion, he has opted for right elbow arthroscopy with debridement and capitellar abrasion arthroplasty. ? ?Plan :   ? ?-Plan for right elbow arthroscopy with debridement ? ? ?After a lengthy discussion of treatment options, including risks, benefits, alternatives, complications of surgical and nonsurgical conservative options, the patient elected surgical repair.  ? ?The patient  is aware of the material risks  and complications including, but not limited to injury to adjacent structures, neurovascular injury, infection, numbness, bleeding, implant failure, thermal burns, stiffness, persistent pain, failure to heal, disease transmission from allograft, need for further surgery,  dislocation, anesthetic risks, blood clots, risks of death,and others. The probabilities of surgical success and failure discussed with patient given their particular co-morbidities.The time and nature of expected rehabilitation and recovery was discussed.The patient's questions were all answered preoperatively.  No barriers to understanding were noted. ?I explained the natural history of the disease process and Rx rationale.  I explained to the patient what I considered to be reasonable expectations given their personal situation.  The final treatment plan was arrived at through a shared patient decision making process model. ? ? ? ? ? ?I personally saw and evaluated the patient, and participated in the management and treatment plan. ? ?Vanetta Mulders, MD ?Attending Physician, Orthopedic Surgery ? ?This document was dictated using Systems analyst. A reasonable attempt at proof reading has been made to minimize errors. ?

## 2021-06-26 ENCOUNTER — Encounter (HOSPITAL_BASED_OUTPATIENT_CLINIC_OR_DEPARTMENT_OTHER): Payer: Self-pay | Admitting: Physical Therapy

## 2021-06-26 ENCOUNTER — Ambulatory Visit (HOSPITAL_BASED_OUTPATIENT_CLINIC_OR_DEPARTMENT_OTHER): Payer: 59 | Attending: Orthopaedic Surgery | Admitting: Physical Therapy

## 2021-06-26 DIAGNOSIS — M6281 Muscle weakness (generalized): Secondary | ICD-10-CM | POA: Insufficient documentation

## 2021-06-26 DIAGNOSIS — M25561 Pain in right knee: Secondary | ICD-10-CM | POA: Insufficient documentation

## 2021-06-26 NOTE — Therapy (Signed)
?OUTPATIENT PHYSICAL THERAPY LOWER EXTREMITY TREATMENT ? ?Patient Name: Oscar Henry ?MRN: [id number] ?DOB:11-16-1985, 36 y.o., male ?Today's Date: 06/26/2021 ? ? PT End of Session - 06/26/21 0834   ? ? Visit Number 5   ? Number of Visits 18   ? Date for PT Re-Evaluation 07/29/21   ? Authorization Type UHC   ? PT Start Time 0802   ? PT Stop Time 0841   ? PT Time Calculation (min) 39 min   ? Activity Tolerance Patient tolerated treatment well   ? Behavior During Therapy [facility] for tasks assessed/performed   ? ?  ?  ? ?  ? ? ? ? ? ?Past Medical History:  ?Diagnosis Date  ? Fibroma of right lower leg 2004  ? ?Past Surgical History:  ?Procedure Laterality Date  ? LAPAROSCOPIC APPENDECTOMY    ? ?Patient Active Problem List  ? Diagnosis Date Noted  ? Elbow pain, right 04/24/2021  ? Pain in joint, lower leg 10/17/2020  ? Sinusitis 10/17/2020  ? ? ?PCP: Josetta Huddle, MD ? ?REFERRING PROVIDER: Josetta Huddle, MD ? ?REFERRING DIAG: M25.561 (ICD-10-CM) - Right knee pain, unspecified chronicity ? ?THERAPY DIAG:  ?Pain in joint of right knee ? ?Muscle weakness (generalized) ? ?ONSET DATE: 10 yrs + ? ?SUBJECTIVE: Pt goes by [name] ? ?SUBJECTIVE STATEMENT: ?Pt states he has more knee soreness more than anything. When he goes into tibial ER, he has pain.  ? ? ?Eval:  ?Pt states that the knee pain has been an ongoing issues. Pt is L leg dominant.  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 He also had a knee scope on the R with no relief in pain. Pt denies knee clicking and locking. Pt states he has back pain that causes him to compensate during the movement. Pt describes the knee pain as shooting down the knee. The pain is under the knee cap and goes down the outside of the leg. Pt states that getting up and out of a chair. Pt states squatting wider and currently squat low bar feels better on the knee. Works out 2x/week and does upper and lower body days with more emphasis on lower body day.  Does not exceed 315lbs on squats, 4x10-15. Endorses movie goers sign. Pt states that getting up and down stairs, getting up and out of truck. Cannot recreate pain with pressure/palpation. Pt has "pulled" his R quad last September.  ? ? Pt states pain does not linger. Only happen in that movement. Pt denies specific MOI or any swelling. Warming up used to relieve pain now it does not. Pt has tried quad rolling before with moderate success. Pt plays in a baseball league- difficult to push off R LE. ? ? 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 ?PAIN:  ?Are you having pain? No ?Anterior knee pain, sharp ? ?LIVING ENVIRONMENT: ?Lives with: lives with their spouse ?Lives in: House/apartment ?Stairs: Yes;  ?Has following equipment at home: None ? 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 ?PLOF: Independent ? ?PATIENT GOALS : reduce pain, improve QOL, get back to baseball ? ? ?OBJECTIVE:  ? ?DIAGNOSTIC FINDINGS: N/A for R knee ? ?PATIENT SURVEYS:  ?FOTO 48   ?8pts MCII ?76 pts  ? ?FUNCTIONAL TESTS:  ?Squatting:   WNL for technique, pain in R knee ?Lunge: WNL for  tech, pain in R knee ?Feet  together squat: WFL for mobility at ankle and knee  ?Split squat: frontal plane knee deviation on R, decreased strength into knee extension ? ?GAIT: ?Distance walked: 64f ?Assistive device utilized: None ?Level of assistance: Complete Independence ?Comments: WNL ? ? ?TODAY'S TREATMENT: ? ?5/10 ? ?R knee IR/ER tibial mob in flexion grade III ? ?Self IASTM technique for anterior knee ? ?Standing quad stretch 30s 3x ? ?TRX BSS 2x10 ?SL RDL 13lbs KB 2x15 ?Tibial IR/ER 2x10 in seated 90/90 ?Goblet/front squat progression ?Jump rope intro ? ?4/25 ? ?Kneeling ant hip mob with band 3-5s 10x ?Reverse Nordic 3s 10x, isometric with band assist 5s 10x ?BSS no weight 5s 10x  2x10 on R, 10x on L  ?Knee ext up2 down1  2x10 ? ? ?4/18 ?Treatment instructions: Expect mild to moderate muscle soreness. S/S of pneumothorax if dry needled over a lung field, and to seek immediate medical attention should they occur. Patient verbalized understanding of these instructions and education. ? ?Patient Consent Given: Yes ?Education handout provided: Yes ?Muscles treated: lateral quad 4 spots starting distal and moving proximal the 2 more proximal spots got the best twitch  ?Electrical stimulation performed:  ?Parameters: N/A ?Treatment response/outcome:twitch elicited last 2 had a great twitch   ? ?Single leg bridge 2x10  ?Lateral band walk blue 2x10  ?Squat with band 2x10 Blue  ?Monster walk 2x10 blue  ?Retro band walk blue 2x10  ?3 way short lunge x5 each leg  ? ?Reviewed how to use his HEP at home.  ? ? ?PATIENT EDUCATION:  ?Education details: MOI, diagnosis, prognosis, DN edu, anatomy, exercise progression, DOMS expectations, muscle firing,  envelope of function, HEP, POC ? ?Person educated: Patient ?Education method: Explanation, Demonstration, Tactile cues, Verbal cues, and Handouts ?Education comprehension: verbalized understanding, returned demonstration, verbal cues required, and tactile cues required ? ? ?HOME EXERCISE PROGRAM: ?Access Code: [email or web address] ?URL: [email or web address] ?Date: 04/30/2021 ?Prepared by: ADaleen Bo? ?ASSESSMENT: ? ?CLINICAL IMPRESSION: ?Pt with increased pain at beginning of today's session at the ant medial knee that is consistent with soft tissue restriction. Pt had reduction of pain with joint mobilization, self stretching, and improving tibial rotation ROM. Pt has roughly 20% tibial IR/ER ROM deficit compared to left that is likely causing pain with deeper squatting and pivoting type motions. Pt able to progress SL stability exercise today without creating pain and advised to intro light plyometrics and focus on quad strength and hip stability with gym based exercise. Plan to check plyometrics  and front squat form technique at next session. Pt would benefit from continued skilled therapy in order to reach goals and maximize functional R LE strength and ROM for full return to PLOF. ? ? ?OBJECTIVE IMPAIRMENTS Abnormal gait, decreased activity tolerance, decreased balance, decreased endurance, decreased mobility, decreased ROM, decreased strength, hypomobility, increased muscle spasms, impaired flexibility, improper body mechanics, postural dysfunction, and pain.  ? ?ACTIVITY LIMITATIONS cleaning, community activity, occupation, yard work, shopping, and exercise and recreation .  ? ?PERSONAL FACTORS Fitness and Time since onset of injury/illness/exacerbation are also affecting patient's functional outcome.  ? ? ?REHAB POTENTIAL: Good ? ?CLINICAL DECISION MAKING: Stable/uncomplicated ? ?EVALUATION COMPLEXITY: Low ? ? ?GOALS: ? ? ?SHORT TERM GOALS: Target date: 08/07/2021 ? ?Pt will become independent with HEP in order to demonstrate synthesis of PT education.' ? ?Goal status: INITIAL ? ?2.  Pt will be able to demonstrate BW squat without pain in order to demonstrate functional improvement in LE function for  self-care and house hold duties. ? ? ?Goal status: INITIAL ? ?3.  Pt will score at least 8 pt increase on FOTO to demonstrate functional improvement in MCII and pt perceived function.   ? ?Goal status: INITIAL ? ? ?LONG TERM GOALS: Target date: 09/18/2021 ? ?Pt  will become independent with final HEP in order to demonstrate synthesis of PT education. ? ? ?Goal status: INITIAL ? ?2.  Pt will score >/= 76 on FOTO to demonstrate improvement in perceived R LE function.  ? ?Goal status: INITIAL ? ?3.  Pt will be able to demonstrate SL squat without pain in order to demonstrate functional improvement in LE function for full return to normal exercise. ? ? ?Goal status: INITIAL ? ?4.  Pt will be able to demonstrate ability to run/jog/cut without pain in order to demonstrate functional improvement and tolerance to  plyometric loading.  ? ?Goal status: INITIAL ? ? ? ?PLAN: ?PT FREQUENCY: 1-2x/week ? ?PT DURATION: 12 weeks (likely D/C by 8 weeks) ? ?PLANNED INTERVENTIONS: Therapeutic exercises, Therapeutic activity, Neu

## 2021-07-10 ENCOUNTER — Ambulatory Visit (HOSPITAL_BASED_OUTPATIENT_CLINIC_OR_DEPARTMENT_OTHER): Payer: 59 | Admitting: Physical Therapy

## 2021-07-10 ENCOUNTER — Encounter (HOSPITAL_BASED_OUTPATIENT_CLINIC_OR_DEPARTMENT_OTHER): Payer: Self-pay | Admitting: Physical Therapy

## 2021-07-10 DIAGNOSIS — M6281 Muscle weakness (generalized): Secondary | ICD-10-CM

## 2021-07-10 DIAGNOSIS — M25561 Pain in right knee: Secondary | ICD-10-CM | POA: Diagnosis not present

## 2021-07-10 NOTE — Therapy (Addendum)
OUTPATIENT PHYSICAL THERAPY LOWER EXTREMITY TREATMENT PHYSICAL THERAPY DISCHARGE SUMMARY  Visits from Start of Care: 6  Plan: Patient agrees to discharge.  Patient goals were mostly met. Patient is being discharged due to not returning to therapy.      Patient Name: Oscar Henry MRN: [id number] DOB:[birth year], 36 y.o., male Today's Date: 07/10/2021   PT End of Session - 07/10/21 0850     Visit Number 6    Number of Visits 18    Date for PT Re-Evaluation 07/29/21    Authorization Type [employer]    PT Start Time 0802    PT Stop Time 0845    PT Time Calculation (min) 43 min    Activity Tolerance Patient tolerated treatment well    Behavior During Therapy [facility] for tasks assessed/performed                 Past Medical History:  Diagnosis Date   Fibroma of right lower leg 2004   Past Surgical History:  Procedure Laterality Date   LAPAROSCOPIC APPENDECTOMY     Patient Active Problem List   Diagnosis Date Noted   Elbow pain, right 04/24/2021   Pain in joint, lower leg 10/17/2020   Sinusitis 10/17/2020    PCP: Josetta Huddle, MD  REFERRING PROVIDER: Josetta Huddle, MD  REFERRING DIAG: [phone] (ICD-10-CM) - Right knee pain, unspecified chronicity  THERAPY DIAG:  Pain in joint of right knee  Muscle weakness (generalized)  ONSET DATE: 10 yrs +  SUBJECTIVE: Pt goes by [name]  SUBJECTIVE STATEMENT: Pt states he really only has pain at this point when he first wakes up. He feels better when jumping rope and pushing off to run during baseball.    Eval:  Pt states that the knee pain has been an ongoing issues. Pt is L leg dominant.  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 He also had a knee scope on the R with no relief in pain. Pt denies knee clicking and locking. Pt states he has back pain that causes him to compensate during the movement. Pt describes the knee pain as shooting down the knee. The pain is under the knee cap  and goes down the outside of the leg. Pt states that getting up and out of a chair. Pt states squatting wider and currently squat low bar feels better on the knee. Works out 2x/week and does upper and lower body days with more emphasis on lower body day. Does not exceed 315lbs on squats, 4x10-15. Endorses movie goers sign. Pt states that getting up and down stairs, getting up and out of truck. Cannot recreate pain with pressure/palpation. Pt has "pulled" his R quad last September.    Pt states pain does not linger. Only happen in that movement. Pt denies specific MOI or any swelling. Warming up used to relieve pain now it does not. Pt has tried quad rolling before with moderate success. Pt plays in a baseball league- difficult to push off R LE.   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; enjoys Careers information officer, wrestling, baseball; former Publishing rights manager  PLOF: Independent  PATIENT GOALS : reduce pain,  improve QOL, get back to baseball   OBJECTIVE:   DIAGNOSTIC FINDINGS: N/A for R knee  PATIENT SURVEYS:  FOTO 64   8pts MCII 76 pts   FUNCTIONAL TESTS:  Squatting:   WNL for technique, pain in R knee Lunge: WNL for tech, pain in R knee Feet  together squat: WFL for mobility at ankle and knee  Split squat: frontal plane knee deviation on R, decreased strength into knee extension  GAIT: Distance walked: 41f Assistive device utilized: None Level of assistance: Complete Independence Comments: WNL   TODAY'S TREATMENT:  5/24  Warm up: standing quad stretch, figure 4, quad foam roll  TRX bulg split squat jump 4x5 Bench BSS jump 4x5 Box heel tap 4 levels 4x6 each Runner's lunge  3x8 on BOSU Glute med side plank 3s 10x  5/10  R knee IR/ER tibial mob in flexion grade III  Self IASTM technique for anterior knee  Standing quad stretch 30s 3x  TRX BSS 2x10 SL RDL 13lbs KB 2x15 Tibial IR/ER 2x10 in seated 90/90 Goblet/front squat progression Jump rope intro  4/25  Kneeling ant hip mob with band 3-5s 10x Reverse Nordic 3s 10x, isometric with band assist 5s 10x BSS no weight 5s 10x  2x10 on R, 10x on L  Knee ext up2 down1 2x10   4/18 Treatment instructions: Expect mild to moderate muscle soreness. S/S of pneumothorax if dry needled over a lung field, and to seek immediate medical attention should they occur. Patient verbalized understanding of these instructions and education.  Patient Consent Given: Yes Education handout provided: Yes Muscles treated: lateral quad 4 spots starting distal and moving proximal the 2 more proximal spots got the best twitch  Electrical stimulation performed:  Parameters: N/A Treatment response/outcome:twitch elicited last 2 had a great twitch    Single leg bridge 2x10  Lateral band walk blue 2x10  Squat with band 2x10 Blue  Monster walk 2x10 blue  Retro band walk blue 2x10  3 way short lunge x5 each leg   Reviewed how to use his HEP at home.    PATIENT EDUCATION:  Education details: MOI, diagnosis, prognosis, DN edu, anatomy, exercise progression, DOMS expectations, muscle firing,  envelope of function, HEP, POC  Person educated: Patient Education method: Explanation, Demonstration, Tactile cues, Verbal cues, and Handouts Education comprehension: verbalized understanding, returned demonstration, verbal cues required, and tactile cues required   HOME EXERCISE PROGRAM: Access Code: [email or web address] URL: [email or web address] Date: 04/30/2021 Prepared by: ADaleen Bo ASSESSMENT:  CLINICAL IMPRESSION: Pt able to progress to more SL plyometric loading at today's session without increasing R knee pain. Pt has  mild motor control deficits with SL activity, improved with cuing for active foot arches and hip lateral engagement. Pt given self progression of SL jumping as he is unable to maintain full technique with unstable surface during Bulg split jumping at this time. Pt likely to D/C at next session if no adverse reactions to today's session.  Pt would benefit from continued skilled therapy in order to reach goals and maximize functional R LE strength and ROM for full return to PLOF.   OBJECTIVE IMPAIRMENTS Abnormal gait, decreased activity tolerance, decreased balance, decreased endurance, decreased mobility, decreased ROM, decreased strength, hypomobility, increased muscle spasms, impaired flexibility, improper body mechanics, postural dysfunction, and pain.   ACTIVITY LIMITATIONS cleaning, community activity, occupation, yard work, shopping, and exercise and recreation .   PERSONAL FACTORS Fitness and Time since onset of injury/illness/exacerbation are also affecting patient's functional outcome.  REHAB POTENTIAL: Good  CLINICAL DECISION MAKING: Stable/uncomplicated  EVALUATION COMPLEXITY: Low   GOALS:   SHORT TERM GOALS: Target date: 08/21/2021  Pt will become independent with HEP in order to demonstrate synthesis of PT education.'  Goal status: MET  2.  Pt will be able to demonstrate BW squat without pain in order to demonstrate functional improvement in LE function for self-care and house hold duties.   Goal status: MET  3.  Pt will score at least 8 pt increase on FOTO to demonstrate functional improvement in MCII and pt perceived function.    Goal status: unable to be assessed   LONG TERM GOALS: Target date: 10/02/2021  Pt  will become independent with final HEP in order to demonstrate synthesis of PT education.   Goal status: MET  2.  Pt will score >/= 76 on FOTO to demonstrate improvement in perceived R LE function.   Goal status: unable to be assessed  3.  Pt will be  able to demonstrate SL squat without pain in order to demonstrate functional improvement in LE function for full return to normal exercise.   Goal status: MET  4.  Pt will be able to demonstrate ability to run/jog/cut without pain in order to demonstrate functional improvement and tolerance to plyometric loading.   Goal status: ongoing    PLAN: PT FREQUENCY: 1-2x/week  PT DURATION: 12 weeks (likely D/C by 8 weeks)  PLANNED INTERVENTIONS: Therapeutic exercises, Therapeutic activity, Neuromuscular re-education, Balance training, Gait training, Patient/Family education, Joint manipulation, Joint mobilization, Stair training, Orthotic/Fit training, Aquatic Therapy, Dry Needling, Electrical stimulation, Spinal manipulation, Spinal mobilization, Cryotherapy, Moist heat, scar mobilization, Taping, Vasopneumatic device, Traction, Ultrasound, Ionotophoresis 33m/ml Dexamethasone, and Manual therapy  PLAN FOR NEXT SESSION:  review reverse Nordic, progress knee extensions as able, CKC SL strength, Eccentric loading on R LE   ADaleen Bo PT DPT  07/10/2021, 8:54 AM

## 2021-07-24 ENCOUNTER — Encounter (HOSPITAL_BASED_OUTPATIENT_CLINIC_OR_DEPARTMENT_OTHER): Payer: 59 | Admitting: Physical Therapy

## 2021-10-14 ENCOUNTER — Ambulatory Visit: Payer: 59 | Admitting: Physician Assistant

## 2021-10-14 ENCOUNTER — Ambulatory Visit (INDEPENDENT_AMBULATORY_CARE_PROVIDER_SITE_OTHER): Payer: 59

## 2021-10-14 ENCOUNTER — Encounter: Payer: Self-pay | Admitting: Physician Assistant

## 2021-10-14 DIAGNOSIS — M25572 Pain in left ankle and joints of left foot: Secondary | ICD-10-CM

## 2021-10-14 NOTE — Progress Notes (Signed)
Office Visit Note   Patient: Oscar Henry           Date of Birth: [birth year]           MRN: [id number] Visit Date: 10/14/2021              Requested by: Josetta Huddle, MD [address] [place],  [place] [address] PCP: Josetta Huddle, MD  Chief Complaint  Patient presents with   Left Foot - Follow-up      HPI: Patient is a pleasant 36 year old gentleman with a 2-day complaint of left great toe pain.  He was doing a Physicist, medical race over the weekend and hit his left great toe on one of the obstacles.  He had ecchymosis and pain since then.  Pain is focused with the ecchymosis over the great toe  Assessment & Plan: Visit Diagnoses:  1. Pain in left ankle and joints of left foot     Plan: Possible nondisplaced fracture of the distal phalanx of the great toe.  There is no displacement.  We will place him in a postop shoe reexamine him in a couple weeks  Follow-Up Instructions: 2 weeks  Ortho Exam  Patient is alert, oriented, no adenopathy, well-dressed, normal affect, normal respiratory effort. Left great toe moderate ecchymosis.  Pulses are intact.  Mild to moderate soft tissue swelling brisk capillary refill at the end of the toe.  Tenderness over the DIP joint.  He has well-maintained alignment without any clinical signs of dislocation.  He is able to fire his FHL and his EHL easily  Imaging: XR Foot Complete Left  Result Date: 10/14/2021 Three-view radiographs of the left foot were reviewed today.  He has well-maintained alignment.  Some soft tissue swelling of the great toe.  Cannot rule out a nondisplaced fracture at the base of the distal phalanx or middle phalanx.  No displacement of fracture is noted  No images are attached to the encounter.  Labs: No results found for: "HGBA1C", "ESRSEDRATE", "CRP", "LABURIC", "REPTSTATUS", "GRAMSTAIN", "CULT", "LABORGA"   No results found for: "ALBUMIN", "PREALBUMIN", "CBC"  No results found for: "MG" No results found  for: "VD25OH"  No results found for: "PREALBUMIN"     No data to display           There is no height or weight on file to calculate BMI.  Orders:  Orders Placed This Encounter  Procedures   XR Foot Complete Left   No orders of the defined types were placed in this encounter.    Procedures: No procedures performed  Clinical Data: No additional findings.  ROS:  All other systems negative, except as noted in the HPI. Review of Systems  Objective: Vital Signs: There were no vitals taken for this visit.  Specialty Comments:  No specialty comments available.  PMFS History: Patient Active Problem List   Diagnosis Date Noted   Elbow pain, right 04/24/2021   Pain in joint, lower leg 10/17/2020   Sinusitis 10/17/2020   Past Medical History:  Diagnosis Date   Fibroma of right lower leg 2004    History reviewed. No pertinent family history.  Past Surgical History:  Procedure Laterality Date   LAPAROSCOPIC APPENDECTOMY     Social History   Occupational History   Not on file  Tobacco Use   Smoking status: Never   Smokeless tobacco: Never  Substance and Sexual Activity   Alcohol use: Yes   Drug use: No   Sexual activity: Not on  file

## 2022-03-27 ENCOUNTER — Other Ambulatory Visit: Payer: Self-pay | Admitting: Internal Medicine

## 2022-03-27 ENCOUNTER — Ambulatory Visit
Admission: RE | Admit: 2022-03-27 | Discharge: 2022-03-27 | Disposition: A | Discharge status: Home / Self Care | Payer: 59 | Source: Ambulatory Visit | Attending: Internal Medicine | Admitting: Internal Medicine

## 2022-03-27 DIAGNOSIS — M79672 Pain in left foot: Secondary | ICD-10-CM
